# Patient Record
Sex: Female | Born: 1985 | Race: Black or African American | Hispanic: No | Marital: Single | Smoking: Never smoker
Health system: Southern US, Community
[De-identification: ages and names within clinical notes are randomized; demographics above are authoritative.]

## PROBLEM LIST (undated history)

## (undated) DIAGNOSIS — Z9109 Other allergy status, other than to drugs and biological substances: Secondary | ICD-10-CM

## (undated) DIAGNOSIS — N9489 Other specified conditions associated with female genital organs and menstrual cycle: Secondary | ICD-10-CM

## (undated) DIAGNOSIS — I10 Essential (primary) hypertension: Secondary | ICD-10-CM

## (undated) DIAGNOSIS — K219 Gastro-esophageal reflux disease without esophagitis: Secondary | ICD-10-CM

## (undated) DIAGNOSIS — B009 Herpesviral infection, unspecified: Secondary | ICD-10-CM

## (undated) DIAGNOSIS — D649 Anemia, unspecified: Secondary | ICD-10-CM

## (undated) DIAGNOSIS — R519 Headache, unspecified: Secondary | ICD-10-CM

## (undated) HISTORY — DX: Herpesviral infection, unspecified: B00.9

## (undated) HISTORY — DX: Morbid (severe) obesity due to excess calories: E66.01

## (undated) HISTORY — DX: Anemia, unspecified: D64.9

## (undated) HISTORY — PX: NO PAST SURGERIES: SHX2092

## (undated) HISTORY — DX: Other specified conditions associated with female genital organs and menstrual cycle: N94.89

## (undated) HISTORY — PX: COLONOSCOPY W/ POLYPECTOMY: SHX1380

## (undated) HISTORY — DX: Other allergy status, other than to drugs and biological substances: Z91.09

## (undated) HISTORY — DX: Headache, unspecified: R51.9

## (undated) HISTORY — PX: UPPER GI ENDOSCOPY: SHX6162

---

## 2019-05-13 ENCOUNTER — Encounter: Payer: Self-pay | Admitting: Emergency Medicine

## 2019-05-13 ENCOUNTER — Ambulatory Visit
Admission: EM | Admit: 2019-05-13 | Discharge: 2019-05-13 | Disposition: A | Discharge status: Home / Self Care | Payer: Managed Care, Other (non HMO) | Attending: Emergency Medicine | Admitting: Emergency Medicine

## 2019-05-13 ENCOUNTER — Other Ambulatory Visit: Payer: Self-pay

## 2019-05-13 DIAGNOSIS — R519 Headache, unspecified: Secondary | ICD-10-CM

## 2019-05-13 DIAGNOSIS — J0100 Acute maxillary sinusitis, unspecified: Secondary | ICD-10-CM | POA: Insufficient documentation

## 2019-05-13 DIAGNOSIS — U071 COVID-19: Secondary | ICD-10-CM | POA: Diagnosis not present

## 2019-05-13 DIAGNOSIS — Z79899 Other long term (current) drug therapy: Secondary | ICD-10-CM | POA: Insufficient documentation

## 2019-05-13 DIAGNOSIS — B349 Viral infection, unspecified: Secondary | ICD-10-CM | POA: Diagnosis present

## 2019-05-13 DIAGNOSIS — J3489 Other specified disorders of nose and nasal sinuses: Secondary | ICD-10-CM

## 2019-05-13 DIAGNOSIS — R0981 Nasal congestion: Secondary | ICD-10-CM

## 2019-05-13 DIAGNOSIS — Z8616 Personal history of COVID-19: Secondary | ICD-10-CM

## 2019-05-13 HISTORY — DX: Essential (primary) hypertension: I10

## 2019-05-13 HISTORY — DX: Personal history of COVID-19: Z86.16

## 2019-05-13 MED ORDER — AMOXICILLIN-POT CLAVULANATE 875-125 MG PO TABS: 1.0000 | ORAL_TABLET | Freq: Two times a day (BID) | ORAL | 0 refills | Status: DC

## 2019-05-13 NOTE — Discharge Instructions (Signed)
Continue over-the-counter medicine as needed.  Take medication as prescribed. Rest. Drink plenty of fluids.   Follow up with your primary care physician this week as needed. Return to Urgent care for new or worsening concerns.

## 2019-05-13 NOTE — ED Provider Notes (Signed)
[facility] ____________________________________________  Time seen: Approximately 3:07 PM  I have reviewed the triage vital signs and the nursing notes.   HISTORY  Chief Complaint sinus congestion and Headache   HPI Charlene Gregory is a 34 y.o. female presenting for evaluation of 6 days of runny nose, nasal congestion and sinus pressure.  Patient reports occasional cough.  Denies chest pain or shortness of breath.  Denies sore throat.  States that she has recurrent sinus issues with recurrent postnasal drip.  Has had increased postnasal drip in the last week.  Denies fevers.  Denies known direct sick contacts.  Continues eat and drink well.  Denies chest pain, shortness of breath, vomiting, diarrhea, fevers or changes in taste or smell.  Unresolved with Tylenol Sinus and Mucinex D.   Past Medical History:  Diagnosis Date  . Hypertension     There are no problems to display for this patient.   Past Surgical History:  Procedure Laterality Date  . NO PAST SURGERIES       No current facility-administered medications for this encounter.  Current Outpatient Medications:  .  amLODipine (NORVASC) 5 MG tablet, Take 5 mg by mouth daily., Disp: , Rfl:  .  butalbital-acetaminophen-caffeine (FIORICET) 50-325-40 MG tablet, Take 1-2 tablets by mouth every 6 (six) hours as needed., Disp: , Rfl:  .  hydrochlorothiazide (HYDRODIURIL) 25 MG tablet, Take 25 mg by mouth daily., Disp: , Rfl:  .  SAXENDA 18 MG/3ML SOPN, Inject 3 mg into the skin daily., Disp: , Rfl:  .  amoxicillin-clavulanate (AUGMENTIN) 875-125 MG tablet, Take 1 tablet by mouth every 12 (twelve) hours., Disp: 20 tablet, Rfl: 0  Allergies Patient has no known allergies.  Family History  Problem Relation Age of Onset  . Healthy Mother   . Cancer Father     Social History Social History   Tobacco Use  . Smoking status: Never Smoker  . Smokeless tobacco: Never Used  Substance Use Topics  . Alcohol use: Yes   . Drug use: Not Currently    Review of Systems Constitutional: No fever.  ENT: No sore throat. As above.  Cardiovascular: Denies chest pain. Respiratory: Denies shortness of breath. Gastrointestinal: No abdominal pain.  No nausea, no vomiting.  No diarrhea.  Musculoskeletal: Negative for back pain. Skin: Negative for rash.   ____________________________________________   PHYSICAL EXAM:  VITAL SIGNS: ED Triage Vitals  Enc Vitals Group     BP 05/13/19 1417 120/79     Pulse Rate 05/13/19 1417 86     Resp 05/13/19 1417 18     Temp 05/13/19 1417 98.1 F (36.7 C)     Temp Source 05/13/19 1417 Oral     SpO2 05/13/19 1417 100 %     Weight 05/13/19 1410 (!) 338 lb (153.3 kg)     Height 05/13/19 1410 5\' 5"  (1.651 m)     Head Circumference --      Peak Flow --      Pain Score 05/13/19 1410 7     Pain Loc --      Pain Edu? --      Excl. in [place]? --     Constitutional: Alert and oriented. Well appearing and in no acute distress. Eyes: Conjunctivae are normal.  Head: Atraumatic.Mild to moderate tenderness to palpation bilateral maxillary sinuses.  No frontal sinus tenderness palpation.  No swelling. No erythema.   Ears: no erythema, mild effusion bilateral, otherwise normal TMs bilaterally.   Nose: nasal congestion with bilateral  nasal turbinate erythema and edema.   Mouth/Throat: Mucous membranes are moist.  Oropharynx non-erythematous.No tonsillar swelling or exudate.  Neck: No stridor.  No cervical spine tenderness to palpation. Hematological/Lymphatic/Immunilogical: No cervical lymphadenopathy. Cardiovascular: Normal rate, regular rhythm. Grossly normal heart sounds.  Good peripheral circulation. Respiratory: Normal respiratory effort.  No retractions.No wheezes, rales or rhonchi. Good air movement.  Musculoskeletal: Steady gait. Neurologic:  Normal speech and language.  Skin:  Skin is warm, dry and intact. No rash noted. Psychiatric: Mood and affect are normal. Speech and  behavior are normal.  ___________________________________________   LABS (all labs ordered are listed, but only abnormal results are displayed)  Labs Reviewed  NOVEL CORONAVIRUS, NAA (HOSP ORDER, SEND-OUT TO REF LAB; TAT 18-24 HRS)     PROCEDURES Procedures   INITIAL IMPRESSION / ASSESSMENT AND PLAN / ED COURSE  Pertinent labs & imaging results that were available during my care of the patient were reviewed by me and considered in my medical decision making (see chart for details).  Very well-appearing patient.  No acute distress.  Suspect recent viral illness.  COVID-19 testing completed and advice given and counseled regarding this.  Discussed onset of sinusitis.  Prescription for Augmentin given.  Continue over-the-counter supportive care.  Rest, fluids and monitoring.  Discussed follow-up and return parameters.  Work note given.Discussed indication, risks and benefits of medications with patient.  Discussed follow up and return parameters including no resolution or any worsening concerns. Patient verbalized understanding and agreed to plan.   ____________________________________________   FINAL CLINICAL IMPRESSION(S) / ED DIAGNOSES  Final diagnoses:  Viral illness  Acute maxillary sinusitis, recurrence not specified     ED Discharge Orders         Ordered    amoxicillin-clavulanate (AUGMENTIN) 875-125 MG tablet  Every 12 hours     05/13/19 1450           Note: This dictation was prepared with Dragon dictation along with smaller phrase technology. Any transcriptional errors that result from this process are unintentional.         Marylene Land, NP 05/13/19 1619

## 2019-05-13 NOTE — ED Triage Notes (Signed)
Pt c/o sinus congestion, headache, fatigue and cough.  Started about 5 days ago.

## 2019-05-15 ENCOUNTER — Telehealth: Payer: Self-pay | Admitting: Emergency Medicine

## 2019-05-15 ENCOUNTER — Emergency Department
Admission: EM | Admit: 2019-05-15 | Discharge: 2019-05-15 | Discharge status: Against Medical Advice | Payer: Managed Care, Other (non HMO)

## 2019-05-15 LAB — NOVEL CORONAVIRUS, NAA (HOSP ORDER, SEND-OUT TO REF LAB; TAT 18-24 HRS): SARS-CoV-2, NAA: DETECTED — AB

## 2019-05-15 NOTE — Telephone Encounter (Signed)
Your test for COVID-19 was positive, meaning that you were infected with the novel coronavirus and could give the germ to others.  Please continue isolation at home, for at least 10 days since the start of your fever/cough/breathlessness and until you have had 3 consecutive days without fever (without taking a fever reducer) and with cough/breathlessness improving. Please continue good preventive care measures, including:  frequent hand-washing, avoid touching your face, cover coughs/sneezes, stay out of crowds and keep a 6 foot distance from others.  Recheck or go to the nearest hospital ED tent for re-assessment if fever/cough/breathlessness return.  Patient contacted and made aware of all results, all questions answered.   

## 2019-05-16 ENCOUNTER — Other Ambulatory Visit: Payer: Self-pay

## 2019-05-16 ENCOUNTER — Emergency Department
Admission: EM | Admit: 2019-05-16 | Discharge: 2019-05-16 | Disposition: A | Discharge status: Home / Self Care | Payer: Managed Care, Other (non HMO) | Attending: Emergency Medicine | Admitting: Emergency Medicine

## 2019-05-16 DIAGNOSIS — Z79899 Other long term (current) drug therapy: Secondary | ICD-10-CM | POA: Insufficient documentation

## 2019-05-16 DIAGNOSIS — U071 COVID-19: Secondary | ICD-10-CM | POA: Insufficient documentation

## 2019-05-16 DIAGNOSIS — I10 Essential (primary) hypertension: Secondary | ICD-10-CM | POA: Insufficient documentation

## 2019-05-16 DIAGNOSIS — R519 Headache, unspecified: Secondary | ICD-10-CM | POA: Diagnosis present

## 2019-05-16 LAB — COMPREHENSIVE METABOLIC PANEL
ALT: 21 U/L (ref 0–44)
AST: 19 U/L (ref 15–41)
Albumin: 3.9 g/dL (ref 3.5–5.0)
Alkaline Phosphatase: 66 U/L (ref 38–126)
Anion gap: 8 (ref 5–15)
BUN: 10 mg/dL (ref 6–20)
CO2: 25 mmol/L (ref 22–32)
Calcium: 9 mg/dL (ref 8.9–10.3)
Chloride: 102 mmol/L (ref 98–111)
Creatinine, Ser: 0.68 mg/dL (ref 0.44–1.00)
GFR calc Af Amer: >60 mL/min (ref >60)
GFR calc non Af Amer: >60 mL/min (ref >60)
Glucose, Bld: 86 mg/dL (ref 70–99)
Potassium: 3.4 mmol/L — ABNORMAL LOW (ref 3.5–5.1)
Sodium: 135 mmol/L (ref 135–145)
Total Bilirubin: 0.4 mg/dL (ref 0.3–1.2)
Total Protein: 7.9 g/dL (ref 6.5–8.1)

## 2019-05-16 LAB — CBC WITH DIFFERENTIAL/PLATELET
Abs Immature Granulocytes: 0.03 10*3/uL (ref 0.00–0.07)
Basophils Absolute: 0 10*3/uL (ref 0.0–0.1)
Basophils Relative: 1 %
Eosinophils Absolute: 0.2 10*3/uL (ref 0.0–0.5)
Eosinophils Relative: 2 %
HCT: 36.5 % (ref 36.0–46.0)
Hemoglobin: 11.6 g/dL — ABNORMAL LOW (ref 12.0–15.0)
Immature Granulocytes: 0 %
Lymphocytes Relative: 53 %
Lymphs Abs: 4 10*3/uL (ref 0.7–4.0)
MCH: 20.1 pg — ABNORMAL LOW (ref 26.0–34.0)
MCHC: 31.8 g/dL (ref 30.0–36.0)
MCV: 63.4 fL — ABNORMAL LOW (ref 80.0–100.0)
Monocytes Absolute: 0.9 10*3/uL (ref 0.1–1.0)
Monocytes Relative: 12 %
Neutro Abs: 2.3 10*3/uL (ref 1.7–7.7)
Neutrophils Relative %: 32 %
Platelets: 195 10*3/uL (ref 150–400)
RBC: 5.76 MIL/uL — ABNORMAL HIGH (ref 3.87–5.11)
RDW: 18.1 % — ABNORMAL HIGH (ref 11.5–15.5)
Smear Review: NORMAL
WBC: 7.4 10*3/uL (ref 4.0–10.5)
nRBC: 0 % (ref 0.0–0.2)

## 2019-05-16 MED ORDER — DEXAMETHASONE SODIUM PHOSPHATE 10 MG/ML IJ SOLN
10.0000 mg | Freq: Once | INTRAMUSCULAR | Status: AC
  Administered 2019-05-16: 10 mg via INTRAVENOUS
  Filled 2019-05-16: qty 1

## 2019-05-16 MED ORDER — SODIUM CHLORIDE 0.9 % IV BOLUS
1000.0000 mL | Freq: Once | INTRAVENOUS | Status: AC
  Administered 2019-05-16: 1000 mL via INTRAVENOUS

## 2019-05-16 MED ORDER — ONDANSETRON HCL 4 MG/2ML IJ SOLN
4.0000 mg | Freq: Once | INTRAMUSCULAR | Status: AC
  Administered 2019-05-16: 21:00:00 4 mg via INTRAVENOUS
  Filled 2019-05-16: qty 2

## 2019-05-16 MED ORDER — FENTANYL CITRATE (PF) 100 MCG/2ML IJ SOLN
50.0000 ug | Freq: Once | INTRAMUSCULAR | Status: AC
  Administered 2019-05-16: 50 ug via INTRAVENOUS
  Filled 2019-05-16: qty 2

## 2019-05-16 MED ORDER — PREDNISONE 10 MG (21) PO TBPK: ORAL_TABLET | ORAL | 0 refills | Status: DC

## 2019-05-16 NOTE — ED Triage Notes (Signed)
Patient dx with COVID yesterday. Patient c/o headache beginning Sunday. Patient has been taking Fiorcet with no relief.

## 2019-05-16 NOTE — ED Provider Notes (Signed)
[facility] Emergency Department Provider Note  ____________________________________________   First MD Initiated Contact with Patient 05/16/19 2038     (approximate)  I have reviewed the triage vital signs and the nursing notes.   HISTORY  Chief Complaint Headache    HPI Henna Dauria is a 34 y.o. female Covid positive with symptoms for approximately 5 days presents emergency department complaint of headache.  States headache is not stopped.  She has tried her normal migraine medications which has not helped.  She states she cannot sleep due to the headache.  She denies any chest pain/shortness of breath, no vomiting or diarrhea.  She states she thought it was a sinus infection but they tested her for Covid and came back positive.  She is also been taking Augmentin.    Past Medical History:  Diagnosis Date  . Hypertension     There are no problems to display for this patient.   Past Surgical History:  Procedure Laterality Date  . NO PAST SURGERIES      Prior to Admission medications   Medication Sig Start Date End Date Taking? Authorizing Provider  amLODipine (NORVASC) 5 MG tablet Take 5 mg by mouth daily. 04/02/19   [provider]  amoxicillin-clavulanate (AUGMENTIN) 875-125 MG tablet Take 1 tablet by mouth every 12 (twelve) hours. 05/13/19   Marylene Land, NP  butalbital-acetaminophen-caffeine (FIORICET) [phone] MG tablet Take 1-2 tablets by mouth every 6 (six) hours as needed. 01/23/19   [provider]  hydrochlorothiazide (HYDRODIURIL) 25 MG tablet Take 25 mg by mouth daily. 03/31/19   [provider]  predniSONE (STERAPRED UNI-PAK 21 TAB) 10 MG (21) TBPK tablet Take 6 pills on day one then decrease by 1 pill each day 05/16/19   Versie Starks, PA-C  SAXENDA 18 MG/3ML SOPN Inject 3 mg into the skin daily. 05/10/19   [provider]    Allergies Patient has no known allergies.  Family History  Problem  Relation Age of Onset  . Healthy Mother   . Cancer Father     Social History Social History   Tobacco Use  . Smoking status: Never Smoker  . Smokeless tobacco: Never Used  Substance Use Topics  . Alcohol use: Yes  . Drug use: Not Currently    Review of Systems  Constitutional: No fever/chills Eyes: No visual changes. ENT: No sore throat. Respiratory: Denies cough Cardiovascular: Denies chest pain Gastrointestinal: Denies abdominal pain Genitourinary: Negative for dysuria. Musculoskeletal: Negative for back pain. Skin: Negative for rash. Psychiatric: no mood changes,     ____________________________________________   PHYSICAL EXAM:  VITAL SIGNS: ED Triage Vitals  Enc Vitals Group     BP 05/16/19 1913 139/80     Pulse Rate 05/16/19 1913 89     Resp 05/16/19 1913 20     Temp 05/16/19 1913 98.9 F (37.2 C)     Temp Source 05/16/19 1913 Oral     SpO2 05/16/19 1913 100 %     Weight 05/16/19 1912 (!) 338 lb (153.3 kg)     Height 05/16/19 1912 5\' 6"  (1.676 m)     Head Circumference --      Peak Flow --      Pain Score 05/16/19 1922 10     Pain Loc --      Pain Edu? --      Excl. in [place]? --     Constitutional: Alert and oriented. Well appearing and in no acute distress. Eyes: Conjunctivae  are normal.  Head: Atraumatic. Nose: No congestion/rhinnorhea. Mouth/Throat: Mucous membranes are moist.   Neck:  supple no lymphadenopathy noted Cardiovascular: Normal rate, regular rhythm. Heart sounds are normal Respiratory: Normal respiratory effort.  No retractions, lungs c t a  GU: deferred Musculoskeletal: FROM all extremities, warm and well perfused Neurologic:  Normal speech and language.  Skin:  Skin is warm, dry and intact. No rash noted. Psychiatric: Mood and affect are normal. Speech and behavior are normal.  ____________________________________________   LABS (all labs ordered are listed, but only abnormal results are displayed)  Labs Reviewed    COMPREHENSIVE METABOLIC PANEL - Abnormal; Notable for the following components:      Result Value   Potassium 3.4 (*)    All other components within normal limits  CBC WITH DIFFERENTIAL/PLATELET - Abnormal; Notable for the following components:   RBC 5.76 (*)    Hemoglobin 11.6 (*)    MCV 63.4 (*)    MCH 20.1 (*)    RDW 18.1 (*)    All other components within normal limits   ____________________________________________   ____________________________________________  RADIOLOGY    ____________________________________________   PROCEDURES  Procedure(s) performed: Saline lock, normal saline 1 L IV, Decadron 10 mg IV, Zofran 4 mg IV, fentanyl 50 mcg IV   Procedures    ____________________________________________   INITIAL IMPRESSION / ASSESSMENT AND PLAN / ED COURSE  Pertinent labs & imaging results that were available during my care of the patient were reviewed by me and considered in my medical decision making (see chart for details).   Patient is a 34 year old Covid positive female that presents emergency department with concerns of a headache.  See HPI  Physical exam shows patient to appear well.  Vitals are normal.  Remainder of exam is unremarkable  CBC, metabolic panel are basically normal Saline lock, normal saline 1 L IV, Decadron 10 mg IV, fentanyl 50 mcg IV, Zofran 4 mg IV  Explained to the patient that due to this being a Covid type headache and not sure that we will be able to get her pain completely under control.  Explained to her that due to it being a virus there is not a lot we can do at this time.  I did tell her we would try steroids and fluids.  She states she understands.   Patient states she feels better after medications.  She is given prescription for steroid pack.  Follow-up with your regular doctor if not improving in 3 days.  Return emergency department if worsening.  Melesia Audet was evaluated in Emergency Department on 05/16/2019 for the  symptoms described in the history of present illness. She was evaluated in the context of the global COVID-19 pandemic, which necessitated consideration that the patient might be at risk for infection with the SARS-CoV-2 virus that causes COVID-19. Institutional protocols and algorithms that pertain to the evaluation of patients at risk for COVID-19 are in a state of rapid change based on information released by regulatory bodies including the CDC and federal and state organizations. These policies and algorithms were followed during the patient's care in the ED.   As part of my medical decision making, I reviewed the following data within the [place] notes reviewed and incorporated, Labs reviewed CBC and metabolic panel are normal, Old chart reviewed, Notes from prior ED visits and [place] Controlled Substance Database  ____________________________________________   FINAL CLINICAL IMPRESSION(S) / ED DIAGNOSES  Final diagnoses:  COVID-19  NEW MEDICATIONS STARTED DURING THIS VISIT:  New Prescriptions   PREDNISONE (STERAPRED UNI-PAK 21 TAB) 10 MG (21) TBPK TABLET    Take 6 pills on day one then decrease by 1 pill each day     Note:  This document was prepared using Dragon voice recognition software and may include unintentional dictation errors.    Versie Starks, PA-C 05/16/19 2222    Harvest Dark, MD 05/16/19 2330

## 2019-05-16 NOTE — Discharge Instructions (Addendum)
Follow-up with your regular doctor as needed.  Return emergency department worsening.  Use steroid pack as prescribed.  You may use this along with your regular migraine medications.  This helps to decrease the inflammation due to Covid and should help you with the pain.

## 2019-05-16 NOTE — ED Notes (Signed)
Pt present today w/ a migraine that has been present x 5 days. Pt tested positive for COVID yesterday. Pt denies typical covid symptoms at this time.

## 2019-05-24 ENCOUNTER — Ambulatory Visit
Admission: EM | Admit: 2019-05-24 | Discharge: 2019-05-24 | Disposition: A | Discharge status: Home / Self Care | Payer: Managed Care, Other (non HMO) | Attending: Family Medicine | Admitting: Family Medicine

## 2019-05-24 ENCOUNTER — Other Ambulatory Visit: Payer: Managed Care, Other (non HMO)

## 2019-05-24 ENCOUNTER — Other Ambulatory Visit: Payer: Self-pay

## 2019-05-24 DIAGNOSIS — K219 Gastro-esophageal reflux disease without esophagitis: Secondary | ICD-10-CM | POA: Diagnosis not present

## 2019-05-24 MED ORDER — OMEPRAZOLE 20 MG PO CPDR: 20.0000 mg | DELAYED_RELEASE_CAPSULE | Freq: Every day | ORAL | 0 refills | Status: DC

## 2019-05-24 NOTE — Discharge Instructions (Signed)
Continue TUMS as needed and diet modifications

## 2019-05-24 NOTE — ED Triage Notes (Signed)
Pt presents with c/o acid reflux that comes and goes. She has had problems with reflux in the past. She has taken Prilosec previously with some improvement in symptoms. She has most recently taken Pepcid AC with little to no relief. She states that no matter what she eats she is uncomfortable and has had to even sleep sitting up. She denies emesis or abd pain.

## 2019-05-26 NOTE — ED Provider Notes (Signed)
[facility]    CSN: [id number] Arrival date & time: 05/24/19  1007      History   Chief Complaint Chief Complaint  Patient presents with  . Gastroesophageal Reflux    HPI Charlene Gregory is a 34 y.o. female.   34 yo female with a c/o acid reflux on and off chronically. Denies any vomiting, fevers, chills, pain.    Gastroesophageal Reflux    Past Medical History:  Diagnosis Date  . COVID-19   . Hypertension     There are no problems to display for this patient.   Past Surgical History:  Procedure Laterality Date  . NO PAST SURGERIES      OB History   No obstetric history on file.      Home Medications    Prior to Admission medications   Medication Sig Start Date End Date Taking? Authorizing Provider  amLODipine (NORVASC) 5 MG tablet Take 5 mg by mouth daily. 04/02/19  Yes [provider]  butalbital-acetaminophen-caffeine (FIORICET) 50-325-40 MG tablet Take 1-2 tablets by mouth every 6 (six) hours as needed. 01/23/19  Yes [provider]  hydrochlorothiazide (HYDRODIURIL) 25 MG tablet Take 25 mg by mouth daily. 03/31/19  Yes [provider]  SAXENDA 18 MG/3ML SOPN Inject 3 mg into the skin daily. 05/10/19  Yes [provider]  amoxicillin-clavulanate (AUGMENTIN) 875-125 MG tablet Take 1 tablet by mouth every 12 (twelve) hours. 05/13/19   Marylene Land, NP  omeprazole (PRILOSEC) 20 MG capsule Take 1 capsule (20 mg total) by mouth daily. 05/24/19   Norval Gable, MD  predniSONE (STERAPRED UNI-PAK 21 TAB) 10 MG (21) TBPK tablet Take 6 pills on day one then decrease by 1 pill each day 05/16/19   Versie Starks, PA-C    Family History Family History  Problem Relation Age of Onset  . Healthy Mother   . Cancer Father     Social History Social History   Tobacco Use  . Smoking status: Never Smoker  . Smokeless tobacco: Never Used  Substance Use Topics  . Alcohol use: Yes  . Drug use: Not Currently      Allergies   Patient has no known allergies.   Review of Systems Review of Systems   Physical Exam Triage Vital Signs ED Triage Vitals  Enc Vitals Group     BP 05/24/19 1046 137/78     Pulse Rate 05/24/19 1046 87     Resp --      Temp 05/24/19 1046 98.2 F (36.8 C)     Temp Source 05/24/19 1046 Oral     SpO2 05/24/19 1046 100 %     Weight 05/24/19 1043 (!) 338 lb (153.3 kg)     Height 05/24/19 1043 5\' 6"  (1.676 m)     Head Circumference --      Peak Flow --      Pain Score 05/24/19 1043 6     Pain Loc --      Pain Edu? --      Excl. in [place]? --    No data found.  Updated Vital Signs BP 137/78 (BP Location: Left Wrist)   Pulse 87   Temp 98.2 F (36.8 C) (Oral)   Ht 5\' 6"  (1.676 m)   Wt (!) 153.3 kg   LMP 04/25/2019 (Approximate)   SpO2 100%   BMI 54.55 kg/m   Visual Acuity Right Eye Distance:   Left Eye Distance:   Bilateral Distance:    Right Eye  Near:   Left Eye Near:    Bilateral Near:     Physical Exam Vitals and nursing note reviewed.  Constitutional:      General: She is not in acute distress.    Appearance: She is not toxic-appearing or diaphoretic.  Cardiovascular:     Rate and Rhythm: Normal rate.  Pulmonary:     Effort: Pulmonary effort is normal. No respiratory distress.  Abdominal:     General: Bowel sounds are normal. There is no distension.     Palpations: Abdomen is soft.     Tenderness: There is no abdominal tenderness.  Neurological:     Mental Status: She is alert.      UC Treatments / Results  Labs (all labs ordered are listed, but only abnormal results are displayed) Labs Reviewed - No data to display  EKG   Radiology No results found.  Procedures Procedures (including critical care time)  Medications Ordered in UC Medications - No data to display  Initial Impression / Assessment and Plan / UC Course  I have reviewed the triage vital signs and the nursing notes.  Pertinent labs & imaging results that  were available during my care of the patient were reviewed by me and considered in my medical decision making (see chart for details).      Final Clinical Impressions(s) / UC Diagnoses   Final diagnoses:  Gastroesophageal reflux disease, unspecified whether esophagitis present     Discharge Instructions     Continue TUMS as needed and diet modifications    ED Prescriptions    Medication Sig Dispense Auth. Provider   omeprazole (PRILOSEC) 20 MG capsule Take 1 capsule (20 mg total) by mouth daily. 30 capsule Norval Gable, MD      1. diagnosis reviewed with patient 2. rx as per orders above; reviewed possible side effects, interactions, risks and benefits  3. Recommend supportive treatment as above 4. Follow-up prn if symptoms worsen or don't improve   PDMP not reviewed this encounter.   Norval Gable, MD 05/26/19 [phone]

## 2019-05-27 ENCOUNTER — Ambulatory Visit: Payer: Managed Care, Other (non HMO) | Attending: Internal Medicine

## 2019-05-27 ENCOUNTER — Other Ambulatory Visit: Payer: Self-pay

## 2019-05-27 ENCOUNTER — Emergency Department: Payer: Managed Care, Other (non HMO)

## 2019-05-27 ENCOUNTER — Encounter: Payer: Self-pay | Admitting: Emergency Medicine

## 2019-05-27 ENCOUNTER — Emergency Department
Admission: EM | Admit: 2019-05-27 | Discharge: 2019-05-27 | Disposition: A | Discharge status: Home / Self Care | Payer: Managed Care, Other (non HMO) | Attending: Emergency Medicine | Admitting: Emergency Medicine

## 2019-05-27 DIAGNOSIS — Z79899 Other long term (current) drug therapy: Secondary | ICD-10-CM | POA: Diagnosis not present

## 2019-05-27 DIAGNOSIS — Z20822 Contact with and (suspected) exposure to covid-19: Secondary | ICD-10-CM

## 2019-05-27 DIAGNOSIS — I10 Essential (primary) hypertension: Secondary | ICD-10-CM | POA: Diagnosis not present

## 2019-05-27 DIAGNOSIS — K219 Gastro-esophageal reflux disease without esophagitis: Secondary | ICD-10-CM | POA: Insufficient documentation

## 2019-05-27 DIAGNOSIS — R079 Chest pain, unspecified: Secondary | ICD-10-CM | POA: Diagnosis present

## 2019-05-27 HISTORY — DX: Gastro-esophageal reflux disease without esophagitis: K21.9

## 2019-05-27 LAB — CBC
HCT: 36.7 % (ref 36.0–46.0)
Hemoglobin: 11.8 g/dL — ABNORMAL LOW (ref 12.0–15.0)
MCH: 20.6 pg — ABNORMAL LOW (ref 26.0–34.0)
MCHC: 32.2 g/dL (ref 30.0–36.0)
MCV: 63.9 fL — ABNORMAL LOW (ref 80.0–100.0)
Platelets: 221 10*3/uL (ref 150–400)
RBC: 5.74 MIL/uL — ABNORMAL HIGH (ref 3.87–5.11)
RDW: 19.5 % — ABNORMAL HIGH (ref 11.5–15.5)
WBC: 10.7 10*3/uL — ABNORMAL HIGH (ref 4.0–10.5)
nRBC: 0 % (ref 0.0–0.2)

## 2019-05-27 LAB — BASIC METABOLIC PANEL
Anion gap: 5 (ref 5–15)
BUN: 9 mg/dL (ref 6–20)
CO2: 31 mmol/L (ref 22–32)
Calcium: 9.1 mg/dL (ref 8.9–10.3)
Chloride: 100 mmol/L (ref 98–111)
Creatinine, Ser: 0.67 mg/dL (ref 0.44–1.00)
GFR calc Af Amer: >60 mL/min (ref >60)
GFR calc non Af Amer: >60 mL/min (ref >60)
Glucose, Bld: 95 mg/dL (ref 70–99)
Potassium: 3.2 mmol/L — ABNORMAL LOW (ref 3.5–5.1)
Sodium: 136 mmol/L (ref 135–145)

## 2019-05-27 LAB — TROPONIN I (HIGH SENSITIVITY): Troponin I (High Sensitivity): <2 ng/L (ref <18)

## 2019-05-27 LAB — POCT PREGNANCY, URINE: Preg Test, Ur: NEGATIVE

## 2019-05-27 MED ORDER — SUCRALFATE 1 G PO TABS: 1.0000 g | ORAL_TABLET | Freq: Four times a day (QID) | ORAL | 0 refills | Status: DC | PRN

## 2019-05-27 NOTE — ED Notes (Signed)
Pt states she has had some acid reflux for the last few days. Pt states she has tried Prilosec with no relief. Pt ambulatory with steady gait to room at this time.

## 2019-05-27 NOTE — Discharge Instructions (Signed)
Continue to take the Prilosec as prescribed.  You should add on the Carafate 4 times daily over the next few weeks.  We have given you referral to see a gastroenterologist and you can make an appointment there for follow-up as well as with your primary care.  Return to the ER for new, worsening, or persistent severe pain, shortness of breath, fever, vomiting, or any other new or worsening symptoms that concern you.

## 2019-05-27 NOTE — ED Triage Notes (Signed)
Pt in via POV, complaining of acid reflux over the last few days, taking Prilosec at home without any relief.  NAD noted at this time.

## 2019-05-27 NOTE — ED Provider Notes (Signed)
[facility] Emergency Department Provider Note ____________________________________________   First MD Initiated Contact with Patient 05/27/19 1652     (approximate)  I have reviewed the triage vital signs and the nursing notes.   HISTORY  Chief Complaint Gastroesophageal Reflux    HPI Charlene Gregory is a 34 y.o. female with PMH as noted below who presents with chest discomfort, intermittent course, worse after eating, and described as a burning sensation.  Is associated with belching and gas.  She states it feels similar to prior GERD.  The patient denies any shortness of breath, cough, fever, or lightheadedness.  She states that she tested positive for Covid a few weeks ago.  Past Medical History:  Diagnosis Date  . Acid reflux disease   . COVID-19   . Hypertension     There are no problems to display for this patient.   Past Surgical History:  Procedure Laterality Date  . NO PAST SURGERIES      Prior to Admission medications   Medication Sig Start Date End Date Taking? Authorizing Provider  amLODipine (NORVASC) 5 MG tablet Take 5 mg by mouth daily. 04/02/19   [provider]  amoxicillin-clavulanate (AUGMENTIN) 875-125 MG tablet Take 1 tablet by mouth every 12 (twelve) hours. 05/13/19   Marylene Land, NP  butalbital-acetaminophen-caffeine (FIORICET) [phone] MG tablet Take 1-2 tablets by mouth every 6 (six) hours as needed. 01/23/19   [provider]  hydrochlorothiazide (HYDRODIURIL) 25 MG tablet Take 25 mg by mouth daily. 03/31/19   [provider]  omeprazole (PRILOSEC) 20 MG capsule Take 1 capsule (20 mg total) by mouth daily. 05/24/19   Norval Gable, MD  predniSONE (STERAPRED UNI-PAK 21 TAB) 10 MG (21) TBPK tablet Take 6 pills on day one then decrease by 1 pill each day 05/16/19   Versie Starks, PA-C  SAXENDA 18 MG/3ML SOPN Inject 3 mg into the skin daily. 05/10/19   [provider]    Allergies Patient  has no known allergies.  Family History  Problem Relation Age of Onset  . Healthy Mother   . Cancer Father     Social History Social History   Tobacco Use  . Smoking status: Never Smoker  . Smokeless tobacco: Never Used  Substance Use Topics  . Alcohol use: Yes  . Drug use: Not Currently    Review of Systems  Constitutional: No fever/chills. Eyes: No redness. ENT: No sore throat. Cardiovascular: Positive for chest pain. Respiratory: Denies shortness of breath. Gastrointestinal: No vomiting or diarrhea. Genitourinary: Negative for flank pain. Musculoskeletal: Negative for back pain. Skin: Negative for rash. Neurological: Negative for headache.   ____________________________________________   PHYSICAL EXAM:  VITAL SIGNS: ED Triage Vitals  Enc Vitals Group     BP 05/27/19 1600 136/83     Pulse Rate 05/27/19 1600 (!) 110     Resp 05/27/19 1600 16     Temp 05/27/19 1600 98.5 F (36.9 C)     Temp Source 05/27/19 1600 Oral     SpO2 05/27/19 1600 98 %     Weight 05/27/19 1601 (!) 335 lb (152 kg)     Height 05/27/19 1601 5\' 5"  (1.651 m)     Head Circumference --      Peak Flow --      Pain Score 05/27/19 1601 0     Pain Loc --      Pain Edu? --      Excl. in [place]? --  Constitutional: Alert and oriented. Well appearing and in no acute distress. Eyes: Conjunctivae are normal.  No scleral icterus. Head: Atraumatic. Nose: No congestion/rhinnorhea. Mouth/Throat: Mucous membranes are moist.   Neck: Normal range of motion.  Cardiovascular: Normal rate, regular rhythm.   Good peripheral circulation. Respiratory: Normal respiratory effort.  No retractions.  Gastrointestinal: No distention.  Musculoskeletal:  Extremities warm and well perfused.  Neurologic:  Normal speech and language. No gross focal neurologic deficits are appreciated.  Skin:  Skin is warm and dry. No rash noted. Psychiatric: Mood and affect are normal. Speech and behavior are  normal.  ____________________________________________   LABS (all labs ordered are listed, but only abnormal results are displayed)  Labs Reviewed  BASIC METABOLIC PANEL - Abnormal; Notable for the following components:      Result Value   Potassium 3.2 (*)    All other components within normal limits  CBC - Abnormal; Notable for the following components:   WBC 10.7 (*)    RBC 5.74 (*)    Hemoglobin 11.8 (*)    MCV 63.9 (*)    MCH 20.6 (*)    RDW 19.5 (*)    All other components within normal limits  POC URINE PREG, ED  POCT PREGNANCY, URINE  TROPONIN I (HIGH SENSITIVITY)   ____________________________________________  EKG  ED ECG REPORT I, Arta Silence, the attending physician, personally viewed and interpreted this ECG.  Date: 05/27/2019 EKG Time: 1602 Rate: 117 Rhythm: Sinus tachycardia QRS Axis: normal Intervals: normal ST/T Wave abnormalities: normal Narrative Interpretation: no evidence of acute ischemia  ____________________________________________  RADIOLOGY  CXR: No focal infiltrate or other acute abnormality  ____________________________________________   PROCEDURES  Procedure(s) performed: No  Procedures  Critical Care performed: No ____________________________________________   INITIAL IMPRESSION / ASSESSMENT AND PLAN / ED COURSE  Pertinent labs & imaging results that were available during my care of the patient were reviewed by me and considered in my medical decision making (see chart for details).  34 year old female with PMH as noted above and a recent history of COVID-19 presents with persistent chest discomfort, worse after eating and associated with belching and gas.  She has had no vomiting, shortness of breath, fever, or lightheadedness.  The patient has been on Prilosec for about the last month without relief.  She states that she has been modifying her diet for presumed GERD.  On exam, she is overall well-appearing.  She  was slightly tachycardic at triage with otherwise normal vital signs.  The remainder of the physical exam is unremarkable.  EKG shows sinus tachycardia with no ischemic findings.  Lab work-up was obtained from triage and shows no significant acute abnormalities.  The troponin is negative.  Overall presentation in this young and otherwise relatively healthy patient is consistent with GERD, given the prior history of the same and the nature and quality of the symptoms.  The patient has no risk factors for ACS or any findings to suggest cardiac etiology.  Although the patient has mild tachycardia, and thus fails the PERC rule, she has no leg swelling or other signs or symptoms of DVT, is not on OCPs, and has had no recent surgery or immobilization.  She denies any shortness of breath, and has no EKG changes or hypoxia.  Also, a PE would not explain the GI symptoms.  There is no indication for PE work-up at this time.  I have instructed the patient to continue Prilosec and will add on Carafate.  I instructed her to  continue her dietary modifications.  I will refer her to GI for follow-up.  At this time, the patient is stable for discharge home.  Return precautions given, and she expresses understanding.  ____________________________________________   FINAL CLINICAL IMPRESSION(S) / ED DIAGNOSES  Final diagnoses:  Gastroesophageal reflux disease, unspecified whether esophagitis present      NEW MEDICATIONS STARTED DURING THIS VISIT:  New Prescriptions   No medications on file     Note:  This document was prepared using Dragon voice recognition software and may include unintentional dictation errors.    Arta Silence, MD 05/27/19 1718

## 2019-05-28 LAB — NOVEL CORONAVIRUS, NAA: SARS-CoV-2, NAA: DETECTED — AB

## 2019-06-03 ENCOUNTER — Telehealth: Payer: Self-pay | Admitting: Gastroenterology

## 2019-06-03 NOTE — Telephone Encounter (Signed)
Called pt to offer f/u from ER with Dr. Marius Ditch,  she has already followed up with a GI doctor per pt

## 2019-06-05 ENCOUNTER — Other Ambulatory Visit: Payer: Self-pay

## 2019-06-05 ENCOUNTER — Ambulatory Visit
Admission: EM | Admit: 2019-06-05 | Discharge: 2019-06-05 | Disposition: A | Discharge status: Home / Self Care | Payer: Managed Care, Other (non HMO) | Attending: Urgent Care | Admitting: Urgent Care

## 2019-06-05 DIAGNOSIS — Z8616 Personal history of COVID-19: Secondary | ICD-10-CM

## 2019-06-05 DIAGNOSIS — J069 Acute upper respiratory infection, unspecified: Secondary | ICD-10-CM | POA: Diagnosis not present

## 2019-06-05 MED ORDER — DOXYCYCLINE HYCLATE 100 MG PO CAPS: 100.0000 mg | ORAL_CAPSULE | Freq: Two times a day (BID) | ORAL | 0 refills | Status: AC

## 2019-06-05 NOTE — ED Triage Notes (Signed)
Pt presents with c/o recent COVID diagnosis (05/13/19). Pt reports most symptoms have improved but still has some sinus congestion, pnd and dry cough. Pt also reports some "whistling" sounds when breathing, she thinks may be coming from her nose. She has tried nasal sprays, saline and OTC sinus meds. She denies fever/chills or other symptoms.

## 2019-06-05 NOTE — Discharge Instructions (Addendum)
It was very nice seeing you today in clinic. Thank you for entrusting me with your care.   Rest and continue to increase hydration. Use antibiotic as prescribed. Continue mucinex and flonase as needed.   Make arrangements to follow up with your regular doctor in 1 week for re-evaluation if not improving. If your symptoms/condition worsens, please seek follow up care either here or in the ER. Please remember, our [facility] providers are "right here with you" when you need [place].   Again, it was my pleasure to take care of you today. Thank you for choosing our clinic. I hope that you start to feel better quickly.   Honor Loh, MSN, APRN, FNP-C, CEN Advanced Practice Provider [facility]

## 2019-06-06 ENCOUNTER — Encounter: Payer: Self-pay | Admitting: Urgent Care

## 2019-06-06 NOTE — ED Provider Notes (Signed)
[place]   Name: Charlene Gregory DOB: [birth year] MRN: [id number] CSN: [id number] PCP: Patient, No Pcp Per  Arrival date and time:  06/05/19 1226  Chief Complaint:  Nasal Congestion and Headache   NOTE: Prior to seeing the patient today, I have reviewed the triage nursing documentation and vital signs. Clinical staff has updated patient's PMH/PSHx, current medication list, and drug allergies/intolerances to ensure comprehensive history available to assist in medical decision making.   History:   HPI: Charlene Gregory is a 34 y.o. female who presents today with complaints of nasal congestion, cough, and generalized headaches that has been going on since she was diagnosed with SARS-CoV-2 (novel coronavirus) on 05/23/2019. Patient reports that she has improved overall since her diagnosis, however the aforementioned symptoms persist. Patient attempted to return to work, however she advised that he employer would not allow her to return until she was retested following her quarantine period. Patient was retested for SARS-CoV-2 on 05/28/2019, at which time her results were still showing as positive. Patient presents today reporting that she hears a "whistling and rattling" when she breathes. PMH (+) seasonal allergies for which she uses daily cetirizine. Patient denies any fevers or chills. She denies that she has experienced any nausea, vomiting, diarrhea, or abdominal pain. She is eating and drinking well. In efforts to conservatively manage her symptoms at home, the patient notes that she has used guaifenesin, pseudoephedrine, and fluticasone, which have not helped to improve her symptoms significantly.   Past Medical History:  Diagnosis Date  . Acid reflux disease   . History of 2019 novel coronavirus disease (COVID-19) 05/13/2019  . Hypertension     Past Surgical History:  Procedure Laterality Date  . NO PAST SURGERIES      Family History  Problem Relation Age of Onset  . Healthy Mother    . Cancer Father     Social History   Tobacco Use  . Smoking status: Never Smoker  . Smokeless tobacco: Never Used  Substance Use Topics  . Alcohol use: Yes  . Drug use: Not Currently    There are no problems to display for this patient.   Home Medications:    Current Meds  Medication Sig  . amLODipine (NORVASC) 5 MG tablet Take 5 mg by mouth daily.  . butalbital-acetaminophen-caffeine (FIORICET) 50-325-40 MG tablet Take 1-2 tablets by mouth every 6 (six) hours as needed.  . hydrochlorothiazide (HYDRODIURIL) 25 MG tablet Take 25 mg by mouth daily.  Marland Kitchen omeprazole (PRILOSEC) 20 MG capsule Take 1 capsule (20 mg total) by mouth daily.  Marland Kitchen SAXENDA 18 MG/3ML SOPN Inject 3 mg into the skin daily.  . sucralfate (CARAFATE) 1 g tablet Take 1 tablet (1 g total) by mouth 4 (four) times daily as needed for up to 15 days.    Allergies:   Patient has no known allergies.  Review of Systems (ROS): Review of Systems  Constitutional: Negative for fatigue and fever.  HENT: Positive for congestion. Negative for ear pain, postnasal drip, rhinorrhea, sinus pressure, sinus pain, sneezing and sore throat.   Eyes: Negative for pain, discharge and redness.  Respiratory: Positive for cough. Negative for chest tightness and shortness of breath.   Cardiovascular: Negative for chest pain and palpitations.  Gastrointestinal: Negative for abdominal pain, diarrhea, nausea and vomiting.  Musculoskeletal: Negative for arthralgias, back pain, myalgias and neck pain.  Skin: Negative for color change, pallor and rash.  Allergic/Immunologic: Positive for environmental allergies.  Neurological: Positive for headaches. Negative for dizziness,  syncope and weakness.  Hematological: Negative for adenopathy.     Vital Signs: Today's Vitals   06/05/19 1247 06/05/19 1249 06/05/19 1324  BP:  (!) 144/90   Pulse:  95   Temp:  98.6 F (37 C)   TempSrc:  Oral   SpO2:  99%   Weight: (!) 335 lb (152 kg)    Height:  5\' 5"  (1.651 m)    PainSc: 0-No pain  0-No pain    Physical Exam: Physical Exam  Constitutional: She is oriented to person, place, and time and well-developed, well-nourished, and in no distress.  HENT:  Head: Normocephalic and atraumatic.  Right Ear: Tympanic membrane normal.  Left Ear: Tympanic membrane normal.  Nose: Mucosal edema and rhinorrhea present. No sinus tenderness.  Mouth/Throat: Uvula is midline and mucous membranes are normal. Posterior oropharyngeal erythema present. No oropharyngeal exudate or posterior oropharyngeal edema.  Eyes: Pupils are equal, round, and reactive to light.  Cardiovascular: Normal rate, regular rhythm, normal heart sounds and intact distal pulses.  Pulmonary/Chest: Effort normal. She has rhonchi (upper airways; clears mostly with cough).  Mild cough noted in clinic. No SOB or increased WOB. No distress. Able to speak in complete sentences without difficulties. SPO2 99% on RA.  Lymphadenopathy:    She has no cervical adenopathy.  Neurological: She is alert and oriented to person, place, and time. Gait normal.  Skin: Skin is warm and dry. No rash noted. She is not diaphoretic.  Psychiatric: Mood, memory, affect and judgment normal.  Nursing note and vitals reviewed.   Urgent Care Treatments / Results:   No orders of the defined types were placed in this encounter.   LABS: PLEASE NOTE: all labs that were ordered this encounter are listed, however only abnormal results are displayed. Labs Reviewed - No data to display  EKG: -None  RADIOLOGY: No results found.  PROCEDURES: Procedures  MEDICATIONS RECEIVED THIS VISIT: Medications - No data to display  PERTINENT CLINICAL COURSE NOTES/UPDATES:   Initial Impression / Assessment and Plan / Urgent Care Course:  Pertinent labs & imaging results that were available during my care of the patient were personally reviewed by me and considered in my medical decision making (see lab/imaging section  of note for values and interpretations).  Charlene Gregory is a 34 y.o. female who presents to [facility] today with complaints of Nasal Congestion and Headache  Patient is well appearing overall in clinic today. She does not appear to be in any acute distress. Presenting symptoms (see HPI) and exam as documented above. Symptoms persistent since SARS-CoV-2 (novel coronavirus) diagnosis on 05/13/2019. She has tried several OTC interventions including mucolytics, decongestants, and nasal sprays. No fevers. Cough is minimal. Eating and drinking well. Given the chronicity of her symptoms, will proceed with treatment for likely secondary bacterial infection. Patient recently treated with amoxicillin-clavulanate back in January. Will cover current infection with a 7 day course of oral doxycycline. Discussed supportive care measures at home during acute phase of illness. Patient to rest as much as possible. She was encouraged to ensure adequate hydration (water and ORS) to prevent dehydration and electrolyte derangements. Patient may use APAP and/or IBU on an as needed basis for pain/fever. Advised to continue guaifenesin, pseudoephedrine, fluticasone, and cetirizine as needed for symptomatic relief.   Discussed follow up with primary care physician in 1 week for re-evaluation. I have reviewed the follow up and strict return precautions for any new or worsening symptoms. Patient is aware of symptoms that would  be deemed urgent/emergent, and would thus require further evaluation either here or in the emergency department. At the time of discharge, she verbalized understanding and consent with the discharge plan as it was reviewed with her. All questions were fielded by provider and/or clinic staff prior to patient discharge.    Final Clinical Impressions / Urgent Care Diagnoses:   Final diagnoses:  Upper respiratory tract infection, unspecified type  History of 2019 novel coronavirus disease (COVID-19)     New Prescriptions:  [place] Controlled Substance Registry consulted? Not Applicable  Meds ordered this encounter  Medications  . doxycycline (VIBRAMYCIN) 100 MG capsule    Sig: Take 1 capsule (100 mg total) by mouth 2 (two) times daily for 7 days.    Dispense:  14 capsule    Refill:  0    Recommended Follow up Care:  Patient encouraged to follow up with the following provider within the specified time frame, or sooner as dictated by the severity of her symptoms. As always, she was instructed that for any urgent/emergent care needs, she should seek care either here or in the emergency department for more immediate evaluation.  Follow-up Information    PCP In 1 week.   Why: General reassessment of symptoms if not improving        NOTE: This note was prepared using Lobbyist along with smaller Company secretary. Despite my best ability to proofread, there is the potential that transcriptional errors may still occur from this process, and are completely unintentional.    Karen Kitchens, NP 06/06/19 1727

## 2019-06-10 ENCOUNTER — Ambulatory Visit: Payer: Managed Care, Other (non HMO) | Attending: Internal Medicine

## 2019-06-10 DIAGNOSIS — Z20822 Contact with and (suspected) exposure to covid-19: Secondary | ICD-10-CM

## 2019-06-12 LAB — NOVEL CORONAVIRUS, NAA: SARS-CoV-2, NAA: NOT DETECTED

## 2019-06-17 ENCOUNTER — Telehealth: Payer: Self-pay | Admitting: *Deleted

## 2019-06-17 NOTE — Telephone Encounter (Signed)
Pt calling to request results of negative covid results be mailed to her; [facility] number provided.

## 2019-06-21 DIAGNOSIS — G43709 Chronic migraine without aura, not intractable, without status migrainosus: Secondary | ICD-10-CM | POA: Insufficient documentation

## 2019-06-21 DIAGNOSIS — J309 Allergic rhinitis, unspecified: Secondary | ICD-10-CM | POA: Insufficient documentation

## 2019-06-21 DIAGNOSIS — I10 Essential (primary) hypertension: Secondary | ICD-10-CM | POA: Insufficient documentation

## 2019-06-21 DIAGNOSIS — K219 Gastro-esophageal reflux disease without esophagitis: Secondary | ICD-10-CM | POA: Insufficient documentation

## 2019-07-15 ENCOUNTER — Ambulatory Visit
Admission: EM | Admit: 2019-07-15 | Discharge: 2019-07-15 | Disposition: A | Discharge status: Home / Self Care | Payer: Managed Care, Other (non HMO) | Attending: Family Medicine | Admitting: Family Medicine

## 2019-07-15 DIAGNOSIS — J302 Other seasonal allergic rhinitis: Secondary | ICD-10-CM | POA: Diagnosis not present

## 2019-07-15 MED ORDER — PREDNISONE 20 MG PO TABS: 20.0000 mg | ORAL_TABLET | Freq: Every day | ORAL | 0 refills | Status: DC

## 2019-07-15 NOTE — Discharge Instructions (Signed)
Follow up with your primary care provider as scheduled this week

## 2019-07-15 NOTE — ED Triage Notes (Signed)
Pt states she had been on Claritin and her MD changed her to Singulair and Levocetirizine as well as Flonase BID about two weeks ago.  Pt states that within a couple days of the med change she started having issues with her throat feeling like it needs cleared.  Has an upcoming appt but doesn't feel like she can wait. Feels like it also isn't helping allergies - is noticing her seasonal allergy symptoms more than before.

## 2019-07-17 NOTE — ED Provider Notes (Signed)
[facility]    CSN: [id number] Arrival date & time: 07/15/19  1530      History   Chief Complaint Chief Complaint  Patient presents with  . Medication Reaction    HPI Charlene Gregory is a 34 y.o. female.   34 yo female with a c/o post nasal drainage, clearing of throat frequently and allergies that are not improving with singulair, levocitirizine and flonase. Denies any fevers, chills, wheezing, chest pain, shortness of breath. States she has an appointment with her PCP later this week but felt she couldn't wait.      Past Medical History:  Diagnosis Date  . Acid reflux disease   . History of 2019 novel coronavirus disease (COVID-19) 05/13/2019  . Hypertension     There are no problems to display for this patient.   Past Surgical History:  Procedure Laterality Date  . NO PAST SURGERIES      OB History   No obstetric history on file.      Home Medications    Prior to Admission medications   Medication Sig Start Date End Date Taking? Authorizing Provider  amLODipine (NORVASC) 5 MG tablet Take 5 mg by mouth daily. 04/02/19  Yes [provider]  butalbital-acetaminophen-caffeine (FIORICET) 50-325-40 MG tablet Take 1-2 tablets by mouth every 6 (six) hours as needed. 01/23/19  Yes [provider]  fluticasone (FLONASE) 50 MCG/ACT nasal spray Place into both nostrils 2 (two) times daily.   Yes [provider]  hydrochlorothiazide (HYDRODIURIL) 25 MG tablet Take 25 mg by mouth daily. 03/31/19  Yes [provider]  levocetirizine (XYZAL) 5 MG tablet Take 5 mg by mouth every evening.   Yes [provider]  montelukast (SINGULAIR) 10 MG tablet Take 10 mg by mouth at bedtime.   Yes [provider]  pantoprazole (PROTONIX) 20 MG tablet Take 20 mg by mouth 2 (two) times daily.   Yes [provider]  SAXENDA 18 MG/3ML SOPN Inject 3 mg into the skin daily. 05/10/19  Yes [provider]  omeprazole  (PRILOSEC) 20 MG capsule Take 1 capsule (20 mg total) by mouth daily. 05/24/19   Norval Gable, MD  predniSONE (DELTASONE) 20 MG tablet Take 1 tablet (20 mg total) by mouth daily. 07/15/19   Norval Gable, MD  sucralfate (CARAFATE) 1 g tablet Take 1 tablet (1 g total) by mouth 4 (four) times daily as needed for up to 15 days. 05/27/19 06/11/19  Arta Silence, MD    Family History Family History  Problem Relation Age of Onset  . Healthy Mother   . Cancer Father     Social History Social History   Tobacco Use  . Smoking status: Never Smoker  . Smokeless tobacco: Never Used  Substance Use Topics  . Alcohol use: Yes    Comment: rarely   . Drug use: Not Currently     Allergies   Patient has no known allergies.   Review of Systems Review of Systems   Physical Exam Triage Vital Signs ED Triage Vitals  Enc Vitals Group     BP 07/15/19 1554 132/90     Pulse Rate 07/15/19 1554 98     Resp 07/15/19 1554 20     Temp 07/15/19 1554 98.4 F (36.9 C)     Temp src --      SpO2 07/15/19 1554 100 %     Weight --      Height --      Head Circumference --  Peak Flow --      Pain Score 07/15/19 1548 0     Pain Loc --      Pain Edu? --      Excl. in [place]? --    No data found.  Updated Vital Signs BP 132/90 (BP Location: Other (Comment)) Comment (BP Location): left forearm  Pulse 98   Temp 98.4 F (36.9 C)   Resp 20   LMP 06/19/2019 (Exact Date)   SpO2 100%   Visual Acuity Right Eye Distance:   Left Eye Distance:   Bilateral Distance:    Right Eye Near:   Left Eye Near:    Bilateral Near:     Physical Exam Vitals and nursing note reviewed.  Constitutional:      General: She is not in acute distress.    Appearance: She is not toxic-appearing or diaphoretic.  HENT:     Right Ear: Tympanic membrane normal.     Left Ear: Tympanic membrane normal.     Nose: Congestion and rhinorrhea present.  Pulmonary:     Effort: Pulmonary effort is normal. No respiratory  distress.     Breath sounds: Normal breath sounds.  Skin:    Findings: No rash.  Neurological:     Mental Status: She is alert.      UC Treatments / Results  Labs (all labs ordered are listed, but only abnormal results are displayed) Labs Reviewed - No data to display  EKG   Radiology No results found.  Procedures Procedures (including critical care time)  Medications Ordered in UC Medications - No data to display  Initial Impression / Assessment and Plan / UC Course  I have reviewed the triage vital signs and the nursing notes.  Pertinent labs & imaging results that were available during my care of the patient were reviewed by me and considered in my medical decision making (see chart for details).      Final Clinical Impressions(s) / UC Diagnoses   Final diagnoses:  Seasonal allergies     Discharge Instructions     Follow up with your primary care provider as scheduled this week    ED Prescriptions    Medication Sig Dispense Auth. Provider   predniSONE (DELTASONE) 20 MG tablet Take 1 tablet (20 mg total) by mouth daily. 5 tablet Norval Gable, MD      1. diagnosis reviewed with patient 2. rx as per orders above; reviewed possible side effects, interactions, risks and benefits  3. Follow-up prn if symptoms worsen or don't improve   PDMP not reviewed this encounter.   Norval Gable, MD 07/17/19 [phone]

## 2019-07-19 DIAGNOSIS — D649 Anemia, unspecified: Secondary | ICD-10-CM | POA: Insufficient documentation

## 2019-09-20 ENCOUNTER — Other Ambulatory Visit: Payer: Self-pay

## 2019-09-20 ENCOUNTER — Ambulatory Visit (INDEPENDENT_AMBULATORY_CARE_PROVIDER_SITE_OTHER)
Admission: RE | Admit: 2019-09-20 | Discharge: 2019-09-20 | Disposition: A | Discharge status: Home / Self Care | Payer: Managed Care, Other (non HMO) | Source: Ambulatory Visit

## 2019-09-20 DIAGNOSIS — J3089 Other allergic rhinitis: Secondary | ICD-10-CM

## 2019-09-20 DIAGNOSIS — J392 Other diseases of pharynx: Secondary | ICD-10-CM

## 2019-09-20 MED ORDER — AZELASTINE HCL 0.1 % NA SOLN: 2.0000 | Freq: Two times a day (BID) | NASAL | 0 refills | Status: DC

## 2019-09-20 MED ORDER — LIDOCAINE VISCOUS HCL 2 % MT SOLN: OROMUCOSAL | 0 refills | Status: DC

## 2019-09-20 MED ORDER — AFRIN NASAL SPRAY 0.05 % NA SOLN: 1.0000 | Freq: Two times a day (BID) | NASAL | 0 refills | Status: DC | PRN

## 2019-09-20 NOTE — Discharge Instructions (Addendum)
Start lidocaine for sore throat, do not eat or drink for the next 40 mins after use as it can stunt your gag reflex. Continue flonase, start azelastine for nasal congestion/drainage. Afrin sparingly. You can use over the counter nasal saline rinse such as neti pot for nasal congestion. Keep hydrated, your urine should be clear to pale yellow in color. Tylenol/motrin for fever and pain. Monitor for any worsening of symptoms, chest pain, shortness of breath, wheezing, swelling of the throat, go to the emergency department for further evaluation needed.   [facility] at [facility]) [place], [place], [place] [address] [phone]  [facility] at [facility] [address] Evlyn Courier [place], [place] [address] [phone]  [facility] at [facility] [address] [place], [place] [address] [phone]  [facility] at [place], [place], [place], [place] [address] [phone]  [facility] at [address] [place], [place] [address] [phone]  [facility] at [address], [place],  [address] [phone]

## 2019-09-20 NOTE — ED Provider Notes (Signed)
Virtual Visit via Video Note:  Charlene Gregory  initiated request for Telemedicine visit with [facility] team. I connected with Lauretta Chester  on 09/20/2019 at 11:55 AM  for a synchronized telemedicine visit using a video enabled HIPPA compliant telemedicine application. I verified that I am speaking with Lauretta Chester  using two identifiers. Yelena Metzer Jodell Cipro, PA-C  was physically located in a [facility] Urgent care site and Temekia Sullen was located at a different location.   The limitations of evaluation and management by telemedicine as well as the availability of in-person appointments were discussed. Patient was informed that she  may incur a bill ( including co-pay) for this virtual visit encounter. Stacie Cabot  expressed understanding and gave verbal consent to proceed with virtual visit.     History of Present Illness:Nylah Staloch  is a 34 y.o. female presents with 3 day history of URI symptoms. Had throat irritation, cough s/p endoscopy 09/17/2019 for which she was told was normal. However 2-3 days ago, started having nasal congestion, post nasal drainage. She feels that she is coughing due to drainage/irritation, and at times is productive. Denies fevers, abdominal symptoms, shob, loss of taste/smell. Fully vaccinated, had COVID 04/2019. otc cold without relief. On flonase, xyzal, singulair.     Past Medical History:  Diagnosis Date  . Acid reflux disease   . History of 2019 novel coronavirus disease (COVID-19) 05/13/2019  . Hypertension     No Known Allergies      Observations/Objective: General: Well appearing, nontoxic, no acute distress. Sitting comfortably. Head: Normocephalic, atraumatic Eye: No conjunctival injection, eyelid swelling. EOMI ENT: Mucus membranes moist, no lip cracking. No obvious nasal drainage. Pulm: Speaking in full sentences without difficulty. Normal effort. No respiratory distress, accessory muscle use. Neuro: Normal mental status. Alert and  oriented x 3.  Assessment and Plan: Patient states having trouble using flonase due to amount of congestion. Will start afrin sparingly. flonase and azelastine as directed. Other symptomatic treatments discussed, if symptoms not improving, can contact clinic with possible prednisone to help with symptoms. Return precautions given.  Follow Up Instructions:    I discussed the assessment and treatment plan with the patient. The patient was provided an opportunity to ask questions and all were answered. The patient agreed with the plan and demonstrated an understanding of the instructions.   The patient was advised to call back or seek an in-person evaluation if the symptoms worsen or if the condition fails to improve as anticipated.  I provided 15 minutes of non-face-to-face time during this encounter.    Ok Edwards, PA-C  09/20/2019 11:55 AM         Ok Edwards, PA-C 09/20/19 1155

## 2019-09-24 ENCOUNTER — Telehealth: Payer: Self-pay | Admitting: Emergency Medicine

## 2019-09-24 MED ORDER — PREDNISONE 10 MG PO TABS: 20.0000 mg | ORAL_TABLET | Freq: Every day | ORAL | 0 refills | Status: DC

## 2019-09-24 NOTE — Telephone Encounter (Signed)
Pt called and sts she is not improved; per note from [place] the provider ok to send in RX for prednisone; pt verbalized understanding

## 2019-11-16 ENCOUNTER — Other Ambulatory Visit: Payer: Self-pay

## 2019-11-16 ENCOUNTER — Ambulatory Visit
Admission: RE | Admit: 2019-11-16 | Discharge: 2019-11-16 | Disposition: A | Discharge status: Home / Self Care | Payer: Managed Care, Other (non HMO) | Source: Ambulatory Visit | Attending: Internal Medicine | Admitting: Internal Medicine

## 2019-11-16 VITALS — BP 142/83 | HR 90 | Temp 98.4°F | Resp 16 | Ht 65.0 in | Wt 325.0 lb

## 2019-11-16 DIAGNOSIS — R05 Cough: Secondary | ICD-10-CM

## 2019-11-16 DIAGNOSIS — R059 Cough, unspecified: Secondary | ICD-10-CM

## 2019-11-16 MED ORDER — ALBUTEROL SULFATE HFA 108 (90 BASE) MCG/ACT IN AERS: 2.0000 | INHALATION_SPRAY | RESPIRATORY_TRACT | 0 refills | Status: DC | PRN

## 2019-11-16 NOTE — ED Provider Notes (Signed)
[facility]    CSN: [id number] Arrival date & time: 11/16/19  1354      History   Chief Complaint Chief Complaint  Patient presents with  . Appointment  . Cough    HPI Charlene Gregory is a 34 y.o. female. who presents with a cough x 2 days from turning a sealing fan when she got hot. Has been taking Robitusin expectorants x 2 days and has really bad allergies and takes 2 different allergy meds and nose spray, but she does not feel it has helped her. Has been having  R ear pain  She Noticed when she lays down ? Wheezig. No fever.  She has an inhaler at home > 1y/o she never had to use. She does not feel bad otherwise.    Past Medical History:  Diagnosis Date  . Acid reflux disease   . History of 2019 novel coronavirus disease (COVID-19) 05/13/2019  . Hypertension     There are no problems to display for this patient.   Past Surgical History:  Procedure Laterality Date  . NO PAST SURGERIES      OB History   No obstetric history on file.      Home Medications    Prior to Admission medications   Medication Sig Start Date End Date Taking? Authorizing Provider  amLODipine (NORVASC) 5 MG tablet Take 5 mg by mouth daily. 04/02/19  Yes [provider]  butalbital-acetaminophen-caffeine (FIORICET) 50-325-40 MG tablet Take 1-2 tablets by mouth every 6 (six) hours as needed. 01/23/19  Yes [provider]  fluticasone (FLONASE) 50 MCG/ACT nasal spray Place into both nostrils 2 (two) times daily.   Yes [provider]  hydrochlorothiazide (HYDRODIURIL) 25 MG tablet Take 25 mg by mouth daily. 03/31/19  Yes [provider]  levocetirizine (XYZAL) 5 MG tablet Take 5 mg by mouth every evening.   Yes [provider]  montelukast (SINGULAIR) 10 MG tablet Take 10 mg by mouth at bedtime.   Yes [provider]  albuterol (VENTOLIN HFA) 108 (90 Base) MCG/ACT inhaler Inhale 2 puffs into the lungs every 4 (four) hours as needed  for wheezing or shortness of breath. 11/16/19   Rodriguez-Southworth, Sunday Spillers, PA-C  azelastine (ASTELIN) 0.1 % nasal spray Place 2 sprays into both nostrils 2 (two) times daily. 09/20/19   Tasia Catchings, Amy V, PA-C  lidocaine (XYLOCAINE) 2 % solution 5-15 mL gurgle as needed 09/20/19   Tasia Catchings, Amy V, PA-C  oxymetazoline (AFRIN NASAL SPRAY) 0.05 % nasal spray Place 1 spray into both nostrils 2 (two) times daily as needed for congestion. Do not use for more than 3 days in a row 09/20/19   Tasia Catchings, Amy V, PA-C  pantoprazole (PROTONIX) 20 MG tablet Take 20 mg by mouth 2 (two) times daily.    [provider]  omeprazole (PRILOSEC) 20 MG capsule Take 1 capsule (20 mg total) by mouth daily. 05/24/19 09/20/19  Norval Gable, MD  sucralfate (CARAFATE) 1 g tablet Take 1 tablet (1 g total) by mouth 4 (four) times daily as needed for up to 15 days. 05/27/19 09/20/19  Arta Silence, MD    Family History Family History  Problem Relation Age of Onset  . Healthy Mother   . Cancer Father     Social History Social History   Tobacco Use  . Smoking status: Never Smoker  . Smokeless tobacco: Never Used  Vaping Use  . Vaping Use: Never used  Substance Use Topics  . Alcohol use: Yes  Comment: rarely   . Drug use: Not Currently     Allergies   Patient has no known allergies.   Review of Systems Review of Systems  Constitutional: Negative for activity change, appetite change, chills, diaphoresis, fatigue and fever.  HENT: Positive for congestion, ear pain, postnasal drip and rhinorrhea. Negative for ear discharge, facial swelling, sore throat and trouble swallowing.   Eyes: Negative for discharge.  Respiratory: Positive for cough and wheezing. Negative for shortness of breath.   Musculoskeletal: Negative for gait problem and myalgias.  Skin: Negative for rash.  Allergic/Immunologic: Positive for environmental allergies.  Neurological: Negative for headaches.  Hematological: Negative for adenopathy.    Physical Exam Triage Vital Signs ED Triage Vitals  Enc Vitals Group     BP 11/16/19 1425 (!) 142/83     Pulse Rate 11/16/19 1425 90     Resp 11/16/19 1425 16     Temp 11/16/19 1425 98.4 F (36.9 C)     Temp Source 11/16/19 1425 Oral     SpO2 11/16/19 1425 99 %     Weight 11/16/19 1423 (!) 325 lb (147.4 kg)     Height 11/16/19 1423 5\' 5"  (1.651 m)     Head Circumference --      Peak Flow --      Pain Score 11/16/19 1423 0     Pain Loc --      Pain Edu? --      Excl. in [place]? --    No data found.  Updated Vital Signs BP (!) 142/83 (BP Location: Left Arm)   Pulse 90   Temp 98.4 F (36.9 C) (Oral)   Resp 16   Ht 5\' 5"  (1.651 m)   Wt (!) 325 lb (147.4 kg)   LMP 11/09/2019 (Approximate)   SpO2 99%   BMI 54.08 kg/m   Visual Acuity Right Eye Distance:   Left Eye Distance:   Bilateral Distance:    Right Eye Near:   Left Eye Near:    Bilateral Near:     Physical Exam Vitals and nursing note reviewed.  Constitutional:      General: She is not in acute distress.    Appearance: Normal appearance. She is obese. She is not toxic-appearing.  HENT:     Head: Atraumatic.     Right Ear: Tympanic membrane, ear canal and external ear normal.     Left Ear: Tympanic membrane, ear canal and external ear normal.     Nose: Congestion and rhinorrhea present.     Mouth/Throat:     Mouth: Mucous membranes are moist.     Pharynx: Oropharynx is clear.  Eyes:     General: No scleral icterus.    Conjunctiva/sclera: Conjunctivae normal.  Cardiovascular:     Rate and Rhythm: Normal rate and regular rhythm.  Pulmonary:     Effort: Pulmonary effort is normal.     Breath sounds: Normal breath sounds. No wheezing.  Musculoskeletal:        General: Normal range of motion.     Cervical back: Neck supple.  Skin:    General: Skin is warm and dry.     Findings: No rash.  Neurological:     Mental Status: She is alert and oriented to person, place, and time.     Gait: Gait normal.   Psychiatric:        Mood and Affect: Mood normal.        Behavior: Behavior normal.  Thought Content: Thought content normal.        Judgment: Judgment normal.    UC Treatments / Results  Labs (all labs ordered are listed, but only abnormal results are displayed) Labs Reviewed - No data to display  EKG   Radiology No results found.  Procedures Procedures (including critical care time)  Medications Ordered in UC Medications - No data to display  Initial Impression / Assessment and Plan / UC Course  I have reviewed the triage vital signs and the nursing notes. She is possibly having reactive airway disease from her allergies, so I sent a new Inhaler to her pharmacy to use q 4h prn cough and wheezing. May need to see an allergist since she has not had allergy testing done yet.  Final Clinical Impressions(s) / UC Diagnoses   Final diagnoses:  Cough     Discharge Instructions     Try the inhaler and see if this helps the cough     ED Prescriptions    Medication Sig Dispense Auth. Provider   albuterol (VENTOLIN HFA) 108 (90 Base) MCG/ACT inhaler Inhale 2 puffs into the lungs every 4 (four) hours as needed for wheezing or shortness of breath. 18 g Rodriguez-Southworth, Sunday Spillers, PA-C     PDMP not reviewed this encounter.   Shelby Mattocks, PA-C 11/16/19 1721

## 2019-11-16 NOTE — ED Triage Notes (Signed)
Patient c/o cough and chest congestion and runny nose that started on Thursday.  Patient denies fevers.

## 2019-11-16 NOTE — Discharge Instructions (Addendum)
Try the inhaler and see if this helps the cough

## 2019-11-18 ENCOUNTER — Inpatient Hospital Stay
Admission: RE | Admit: 2019-11-18 | Discharge: 2019-11-18 | Disposition: A | Discharge status: Home / Self Care | Payer: Managed Care, Other (non HMO) | Source: Ambulatory Visit

## 2019-12-03 ENCOUNTER — Other Ambulatory Visit: Payer: Self-pay | Admitting: Internal Medicine

## 2020-02-03 DIAGNOSIS — D500 Iron deficiency anemia secondary to blood loss (chronic): Secondary | ICD-10-CM | POA: Insufficient documentation

## 2020-02-27 ENCOUNTER — Encounter: Payer: Self-pay | Admitting: Intensive Care

## 2020-02-27 ENCOUNTER — Emergency Department: Payer: Managed Care, Other (non HMO)

## 2020-02-27 ENCOUNTER — Other Ambulatory Visit: Payer: Self-pay

## 2020-02-27 ENCOUNTER — Emergency Department
Admission: EM | Admit: 2020-02-27 | Discharge: 2020-02-27 | Disposition: A | Discharge status: Home / Self Care | Payer: Managed Care, Other (non HMO) | Attending: Emergency Medicine | Admitting: Emergency Medicine

## 2020-02-27 DIAGNOSIS — Z79899 Other long term (current) drug therapy: Secondary | ICD-10-CM | POA: Diagnosis not present

## 2020-02-27 DIAGNOSIS — Z8616 Personal history of COVID-19: Secondary | ICD-10-CM | POA: Insufficient documentation

## 2020-02-27 DIAGNOSIS — N839 Noninflammatory disorder of ovary, fallopian tube and broad ligament, unspecified: Secondary | ICD-10-CM | POA: Insufficient documentation

## 2020-02-27 DIAGNOSIS — R1032 Left lower quadrant pain: Secondary | ICD-10-CM | POA: Diagnosis present

## 2020-02-27 DIAGNOSIS — I10 Essential (primary) hypertension: Secondary | ICD-10-CM | POA: Diagnosis not present

## 2020-02-27 DIAGNOSIS — R1030 Lower abdominal pain, unspecified: Secondary | ICD-10-CM

## 2020-02-27 DIAGNOSIS — N838 Other noninflammatory disorders of ovary, fallopian tube and broad ligament: Secondary | ICD-10-CM

## 2020-02-27 LAB — COMPREHENSIVE METABOLIC PANEL
ALT: 14 U/L (ref 0–44)
AST: 13 U/L — ABNORMAL LOW (ref 15–41)
Albumin: 3.7 g/dL (ref 3.5–5.0)
Alkaline Phosphatase: 73 U/L (ref 38–126)
Anion gap: 7 (ref 5–15)
BUN: 11 mg/dL (ref 6–20)
CO2: 27 mmol/L (ref 22–32)
Calcium: 8.8 mg/dL — ABNORMAL LOW (ref 8.9–10.3)
Chloride: 104 mmol/L (ref 98–111)
Creatinine, Ser: 0.66 mg/dL (ref 0.44–1.00)
GFR, Estimated: >60 mL/min (ref >60)
Glucose, Bld: 105 mg/dL — ABNORMAL HIGH (ref 70–99)
Potassium: 3.7 mmol/L (ref 3.5–5.1)
Sodium: 138 mmol/L (ref 135–145)
Total Bilirubin: 0.5 mg/dL (ref 0.3–1.2)
Total Protein: 7.8 g/dL (ref 6.5–8.1)

## 2020-02-27 LAB — CBC
HCT: 34.2 % — ABNORMAL LOW (ref 36.0–46.0)
Hemoglobin: 10.8 g/dL — ABNORMAL LOW (ref 12.0–15.0)
MCH: 20 pg — ABNORMAL LOW (ref 26.0–34.0)
MCHC: 31.6 g/dL (ref 30.0–36.0)
MCV: 63.3 fL — ABNORMAL LOW (ref 80.0–100.0)
Platelets: 206 10*3/uL (ref 150–400)
RBC: 5.4 MIL/uL — ABNORMAL HIGH (ref 3.87–5.11)
RDW: 21.8 % — ABNORMAL HIGH (ref 11.5–15.5)
WBC: 12.4 10*3/uL — ABNORMAL HIGH (ref 4.0–10.5)
nRBC: 0 % (ref 0.0–0.2)

## 2020-02-27 LAB — URINALYSIS, COMPLETE (UACMP) WITH MICROSCOPIC
Bilirubin Urine: NEGATIVE
Glucose, UA: NEGATIVE mg/dL
Hgb urine dipstick: NEGATIVE
Ketones, ur: NEGATIVE mg/dL
Nitrite: NEGATIVE
Protein, ur: NEGATIVE mg/dL
Specific Gravity, Urine: 1.026 (ref 1.005–1.030)
pH: 6 (ref 5.0–8.0)

## 2020-02-27 LAB — LIPASE, BLOOD: Lipase: 24 U/L (ref 11–51)

## 2020-02-27 LAB — POC URINE PREG, ED: Preg Test, Ur: NEGATIVE

## 2020-02-27 MED ORDER — KETOROLAC TROMETHAMINE 30 MG/ML IJ SOLN
30.0000 mg | Freq: Once | INTRAMUSCULAR | Status: AC
  Administered 2020-02-27: 30 mg via INTRAMUSCULAR
  Filled 2020-02-27: qty 1

## 2020-02-27 NOTE — ED Triage Notes (Signed)
C/o abd pain for a few days. C/o mass on left ovary. Currently trying to get pregnant. Denies discharge or bleeding at this time

## 2020-02-27 NOTE — Discharge Instructions (Addendum)
Please call your gynecologist tomorrow for a recheck early next week.  Return to the emergency department for any worsening of symptoms.

## 2020-02-27 NOTE — ED Provider Notes (Signed)
[facility] Emergency Department Provider Note  ____________________________________________  Time seen: Approximately 9:26 PM  I have reviewed the triage vital signs and the nursing notes.   HISTORY  Chief Complaint Abdominal Pain    HPI Charlene Gregory is a 34 y.o. female that presents to the emergency department for evaluation of intermittent left lower quadrant discomfort for 2 months.  Patient had an ultrasound done in October and was told that she has a mass on her left ovary, that is likely causing her discomfort. Discomfort is occasionally associated with intermittent vaginal spotting. She was told by gynecology to come to the emergency department for any pain.  She has not had any discomfort in a couple of days and but returned today.  She had a full pelvic exam with OB/GYN last month, including STD testing. Patient is not concerned for any STDs. She has yearly Pap smears after an abnormal Pap smear several years ago. Patient is trying to get pregnant. No fevers, nausea, vomiting, vaginal discharge, urinary symptoms..   Past Medical History:  Diagnosis Date  . Acid reflux disease   . History of 2019 novel coronavirus disease (COVID-19) 05/13/2019  . Hypertension     There are no problems to display for this patient.   Past Surgical History:  Procedure Laterality Date  . NO PAST SURGERIES      Prior to Admission medications   Medication Sig Start Date End Date Taking? Authorizing Provider  albuterol (VENTOLIN HFA) 108 (90 Base) MCG/ACT inhaler Inhale 2 puffs into the lungs every 4 (four) hours as needed for wheezing or shortness of breath. 11/16/19   Rodriguez-Southworth, Sunday Spillers, PA-C  amLODipine (NORVASC) 5 MG tablet Take 5 mg by mouth daily. 04/02/19   [provider]  azelastine (ASTELIN) 0.1 % nasal spray Place 2 sprays into both nostrils 2 (two) times daily. 09/20/19   Ok Edwards, PA-C  butalbital-acetaminophen-caffeine (FIORICET)  [phone] MG tablet Take 1-2 tablets by mouth every 6 (six) hours as needed. 01/23/19   [provider]  fluticasone (FLONASE) 50 MCG/ACT nasal spray Place into both nostrils 2 (two) times daily.    [provider]  hydrochlorothiazide (HYDRODIURIL) 25 MG tablet Take 25 mg by mouth daily. 03/31/19   [provider]  levocetirizine (XYZAL) 5 MG tablet Take 5 mg by mouth every evening.    [provider]  lidocaine (XYLOCAINE) 2 % solution 5-15 mL gurgle as needed 09/20/19   Tasia Catchings, Amy V, PA-C  montelukast (SINGULAIR) 10 MG tablet Take 10 mg by mouth at bedtime.    [provider]  oxymetazoline (AFRIN NASAL SPRAY) 0.05 % nasal spray Place 1 spray into both nostrils 2 (two) times daily as needed for congestion. Do not use for more than 3 days in a row 09/20/19   Tasia Catchings, Amy V, PA-C  pantoprazole (PROTONIX) 20 MG tablet Take 20 mg by mouth 2 (two) times daily.    [provider]  omeprazole (PRILOSEC) 20 MG capsule Take 1 capsule (20 mg total) by mouth daily. 05/24/19 09/20/19  Norval Gable, MD  sucralfate (CARAFATE) 1 g tablet Take 1 tablet (1 g total) by mouth 4 (four) times daily as needed for up to 15 days. 05/27/19 09/20/19  Arta Silence, MD    Allergies Patient has no known allergies.  Family History  Problem Relation Age of Onset  . Healthy Mother   . Cancer Father     Social History Social History   Tobacco Use  . Smoking  status: Never Smoker  . Smokeless tobacco: Never Used  Vaping Use  . Vaping Use: Never used  Substance Use Topics  . Alcohol use: Yes    Comment: rarely   . Drug use: Not Currently     Review of Systems  Constitutional: No fever/chills ENT: No upper respiratory complaints. Cardiovascular: No chest pain. Respiratory: No cough. No SOB. Gastrointestinal: Positive for intermittent abdominal discomfort.  No nausea, no vomiting.  Musculoskeletal: Negative for musculoskeletal pain. Skin: Negative for rash,  abrasions, lacerations, ecchymosis. Neurological: Negative for headaches, numbness or tingling   ____________________________________________   PHYSICAL EXAM:  VITAL SIGNS: ED Triage Vitals  Enc Vitals Group     BP 02/27/20 1752 (!) 153/86     Pulse Rate 02/27/20 1752 93     Resp 02/27/20 1752 16     Temp 02/27/20 1752 98.5 F (36.9 C)     Temp Source 02/27/20 1752 Oral     SpO2 02/27/20 1752 100 %     Weight 02/27/20 1753 (!) 350 lb (158.8 kg)     Height 02/27/20 1753 5\' 5"  (1.651 m)     Head Circumference --      Peak Flow --      Pain Score 02/27/20 1753 5     Pain Loc --      Pain Edu? --      Excl. in [place]? --      Constitutional: Alert and oriented. Well appearing and in no acute distress. Eyes: Conjunctivae are normal. PERRL. EOMI. Head: Atraumatic. ENT:      Ears:      Nose: No congestion/rhinnorhea.      Mouth/Throat: Mucous membranes are moist.  Neck: No stridor. Cardiovascular: Normal rate, regular rhythm.  Good peripheral circulation. Respiratory: Normal respiratory effort without tachypnea or retractions. Lungs CTAB. Good air entry to the bases with no decreased or absent breath sounds. Gastrointestinal: Bowel sounds 4 quadrants. Soft and nontender to palpation. No guarding or rigidity. No palpable masses. No distention. Musculoskeletal: Full range of motion to all extremities. No gross deformities appreciated. Neurologic:  Normal speech and language. No gross focal neurologic deficits are appreciated.  Skin:  Skin is warm, dry and intact. No rash noted. Psychiatric: Mood and affect are normal. Speech and behavior are normal. Patient exhibits appropriate insight and judgement.   ____________________________________________   LABS (all labs ordered are listed, but only abnormal results are displayed)  Labs Reviewed  COMPREHENSIVE METABOLIC PANEL - Abnormal; Notable for the following components:      Result Value   Glucose, Bld 105 (*)    Calcium 8.8  (*)    AST 13 (*)    All other components within normal limits  CBC - Abnormal; Notable for the following components:   WBC 12.4 (*)    RBC 5.40 (*)    Hemoglobin 10.8 (*)    HCT 34.2 (*)    MCV 63.3 (*)    MCH 20.0 (*)    RDW 21.8 (*)    All other components within normal limits  URINALYSIS, COMPLETE (UACMP) WITH MICROSCOPIC - Abnormal; Notable for the following components:   Color, Urine YELLOW (*)    APPearance CLOUDY (*)    Leukocytes,Ua SMALL (*)    Bacteria, UA RARE (*)    All other components within normal limits  URINE CULTURE  LIPASE, BLOOD  POC URINE PREG, ED   ____________________________________________  EKG   ____________________________________________  RADIOLOGY   US PELVIC COMPLETE W TRANSVAGINAL AND TORSION  R/O  Result Date: 02/27/2020 CLINICAL DATA:  Left lower quadrant pain for 2-3 days EXAM: TRANSABDOMINAL AND TRANSVAGINAL ULTRASOUND OF PELVIS DOPPLER ULTRASOUND OF OVARIES TECHNIQUE: Both transabdominal and transvaginal ultrasound examinations of the pelvis were performed. Transabdominal technique was performed for global imaging of the pelvis including uterus, ovaries, adnexal regions, and pelvic cul-de-sac. It was necessary to proceed with endovaginal exam following the transabdominal exam to visualize the uterus, endometrium, and ovaries. Color and duplex Doppler ultrasound was utilized to evaluate blood flow to the ovaries. COMPARISON:  None. FINDINGS: Uterus Measurements: 7.3 x 4.5 x 5.1 cm = volume: 87 mL. Anteverted uterus. Heterogeneously echogenic and vascular intramural structure along the anterior uterine body measuring approximately 1.7 x 2.3 x 2.1 cm, likely a small intramural fibroid. Trace nonspecific fluid towards the external cervical os. Endometrium Thickness: 12.9 mm, upper limits normal. No focal abnormality visualized. Right ovary Measurements: 5.8 x 5.9 x 3.5 cm = volume: 41 mL. Several anechoic follicles are present in the right ovary with  at least 2 thick-walled cystic foci with internal heterogeneity, the first measuring 2.5 x 1.9 x 2.0 cm with a peripheral rim of color flow which could reflect a corpus luteum or corpus hemorrhagicum. A second more lesion with some heterogeneous internal echogenicity including the mixed solid and cystic appearance measures 2.7 x 1.7 x 2.1 cm in size without significant internal color flow or peripheral vascularity. Could reflect an involuting or hemorrhagic cyst with likely benign though indeterminate characteristics. Left ovary Measurements: 8.7 x 6.2 x 7.8 cm = volume: 228 mL. 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 Due to the extensive shadowing it is difficult to fully visualize the normal ovarian parenchyma but without other gross abnormality in the left adnexa. Pulsed Doppler evaluation of both ovaries demonstrates normal low-resistance arterial and venous waveforms though assessment of the color flow in the left ovary was obtained along the periphery of the heterogeneous mass due to extensive shadowing. Other findings No abnormal free fluid. IMPRESSION: 1. Shadowing in the left adnexa limits Doppler assessment though normal waveforms are seen in what appears to be the periphery of the ovarian parenchyma. Normal Doppler evaluation of the right ovary as well without evidence of torsion. 2. Heterogeneous mass which appears to arise from the left ovary/adnexa with sonographic features suggestive of an ovarian dermoid. [facility] gynecologic consultation for further evaluation. 3. Probable corpus luteum seen in the right ovary measuring up to 2.5 cm while a separate more indeterminate avascular 2.7 cm thick-walled cystic foci in the right ovary may reflect involuting  follicle or hemorrhagic cyst. Could consider 6-12 week sonographic follow-up. Note: This recommendation does not apply to premenarchal patients or to those with increased risk (genetic, family history, elevated tumor markers or other high-risk factors) of ovarian cancer. Reference: Radiology 2019 Nov; 293(2):359-371. 4. Heterogeneously echogenic intramural structure along the anterior uterine body most compatible with intramural fibroid. 5. Trace anechoic fluid about the external cervical os, correlate with clinical features. Electronically Signed   By: Lovena Le M.D.   On: 02/27/2020 21:43    ____________________________________________    PROCEDURES  Procedure(s) performed:    Procedures    Medications  ketorolac (TORADOL) 30 MG/ML injection 30 mg (30 mg Intramuscular Given 02/27/20 2227)     ____________________________________________  INITIAL IMPRESSION / ASSESSMENT AND PLAN / ED COURSE  Pertinent labs & imaging results that were available during my care of the patient were reviewed by me and considered in my medical decision making (see chart for details).  Review of the [place] CSRS was performed in accordance of the [place] prior to dispensing any controlled drugs.    Patient presented to the emergency department for evaluation of left lower quadrant discomfort.  Vital signs and exam are reassuring. Abdomen is soft nontender to palpation. No torsion identified on ultrasound. Patient will discuss ultrasound findings with her gynecologist. See above. Patient is to follow up with gynecology as directed. Patient is given ED precautions to return to the ED for any worsening or new symptoms.  Temitope Flammer was evaluated in Emergency Department on 02/27/2020 for the symptoms described in the history of present illness. She was evaluated in the context of the global COVID-19 pandemic, which necessitated consideration that the patient might be at risk for infection with the SARS-CoV-2  virus that causes COVID-19. Institutional protocols and algorithms that pertain to the evaluation of patients at risk for COVID-19 are in a state of rapid change based on information released by regulatory bodies including the CDC and federal and state organizations. These policies and algorithms were followed during the patient's care in the ED.   ____________________________________________  FINAL CLINICAL IMPRESSION(S) / ED DIAGNOSES  Final diagnoses:  Lower abdominal pain  Left tubo-ovarian mass      NEW MEDICATIONS STARTED DURING THIS VISIT:  ED Discharge Orders    None          This chart was dictated using voice recognition software/Dragon. Despite best efforts to proofread, errors can occur which can change the meaning. Any change was purely unintentional.    Laban Emperor, PA-C 02/27/20 2253    Nance Pear, MD 02/27/20 2318

## 2020-02-27 NOTE — ED Notes (Signed)
Pt reports lower mid-abd pain for "last couple days." Pt describes pain as "pressure, not stabbing or anything like that." Pt denies n/v/d  Pt reports her OBGYN found an mass on her left ovary and told her to come get checked if it caused her pain.   First day of LMP 02/03/2020. Pt reports "lingering spotting" and "light pink when she wipes" especially after intercourse.

## 2020-02-27 NOTE — ED Notes (Signed)
poct pregnancy Negative 

## 2020-02-28 LAB — URINE CULTURE

## 2020-04-25 NOTE — L&D Delivery Note (Signed)
Delivery Note Arrived to room to evaluate for cervical change. Patient was found to be complete and +2 station. Fetal head was delivered. A nuchal was present but unable to be reduced. Shoulders and body followed without difficulty. Infant was placed on mother's abdomen, mouth and nose were bulb suctioned and let out a vigorous cry. Delayed cord clamping was performed for 60 seconds. Venous cord blood was collected. Placenta delivered spontaneously. Cervix, vagina, perineum and labia were inspected and a second degree perineal laceration was noted. Some lower uterine segment atony was noted and was made firm with bimanual massage. TXA 1g IV was administered. The second degree laceration was repaired using 3-0 Vicryl. Uterus fundus firm. Placenta was inspected, found to be intact with a 3 vessel cord. Cytotec 103mg was given per rectum. Counts were correct.   At 10:51 PM a viable and healthy female was delivered via Vaginal, Spontaneous (Presentation: Right Occiput Anterior).  APGAR: 8,9 ; weight: see infant's chart Placenta status: Spontaneous, Intact.  Cord: 3 vessels with the following complications: None.  Cord pH: Not collected  Anesthesia: Epidural Episiotomy: None Lacerations: 2nd degree Suture Repair: 3.0 vicryl Est. Blood Loss (mL): [id number] Mom to postpartum.  Baby to Couplet care / Skin to Skin.  MDrema Dallas9/09/2020, 11:08 PM

## 2020-04-30 ENCOUNTER — Other Ambulatory Visit: Payer: Self-pay

## 2020-04-30 ENCOUNTER — Emergency Department: Payer: Managed Care, Other (non HMO)

## 2020-04-30 ENCOUNTER — Emergency Department
Admission: EM | Admit: 2020-04-30 | Discharge: 2020-04-30 | Disposition: A | Discharge status: Home / Self Care | Payer: Managed Care, Other (non HMO) | Attending: Emergency Medicine | Admitting: Emergency Medicine

## 2020-04-30 DIAGNOSIS — R102 Pelvic and perineal pain: Secondary | ICD-10-CM

## 2020-04-30 DIAGNOSIS — O3481 Maternal care for other abnormalities of pelvic organs, first trimester: Secondary | ICD-10-CM | POA: Diagnosis not present

## 2020-04-30 DIAGNOSIS — Z8616 Personal history of COVID-19: Secondary | ICD-10-CM | POA: Diagnosis not present

## 2020-04-30 DIAGNOSIS — O99611 Diseases of the digestive system complicating pregnancy, first trimester: Secondary | ICD-10-CM | POA: Diagnosis not present

## 2020-04-30 DIAGNOSIS — O10011 Pre-existing essential hypertension complicating pregnancy, first trimester: Secondary | ICD-10-CM | POA: Insufficient documentation

## 2020-04-30 DIAGNOSIS — K219 Gastro-esophageal reflux disease without esophagitis: Secondary | ICD-10-CM | POA: Diagnosis not present

## 2020-04-30 DIAGNOSIS — N83209 Unspecified ovarian cyst, unspecified side: Secondary | ICD-10-CM

## 2020-04-30 DIAGNOSIS — Z3A01 Less than 8 weeks gestation of pregnancy: Secondary | ICD-10-CM | POA: Diagnosis not present

## 2020-04-30 DIAGNOSIS — N83202 Unspecified ovarian cyst, left side: Secondary | ICD-10-CM | POA: Insufficient documentation

## 2020-04-30 DIAGNOSIS — O26891 Other specified pregnancy related conditions, first trimester: Secondary | ICD-10-CM | POA: Diagnosis present

## 2020-04-30 DIAGNOSIS — Z79899 Other long term (current) drug therapy: Secondary | ICD-10-CM | POA: Insufficient documentation

## 2020-04-30 LAB — URINALYSIS, COMPLETE (UACMP) WITH MICROSCOPIC
Bilirubin Urine: NEGATIVE
Glucose, UA: NEGATIVE mg/dL
Hgb urine dipstick: NEGATIVE
Ketones, ur: NEGATIVE mg/dL
Leukocytes,Ua: NEGATIVE
Nitrite: NEGATIVE
Protein, ur: NEGATIVE mg/dL
Specific Gravity, Urine: 1.019 (ref 1.005–1.030)
pH: 5 (ref 5.0–8.0)

## 2020-04-30 LAB — COMPREHENSIVE METABOLIC PANEL
ALT: 15 U/L (ref 0–44)
AST: 14 U/L — ABNORMAL LOW (ref 15–41)
Albumin: 3.8 g/dL (ref 3.5–5.0)
Alkaline Phosphatase: 59 U/L (ref 38–126)
Anion gap: 8 (ref 5–15)
BUN: 11 mg/dL (ref 6–20)
CO2: 24 mmol/L (ref 22–32)
Calcium: 9.2 mg/dL (ref 8.9–10.3)
Chloride: 101 mmol/L (ref 98–111)
Creatinine, Ser: 0.56 mg/dL (ref 0.44–1.00)
GFR, Estimated: >60 mL/min (ref >60)
Glucose, Bld: 93 mg/dL (ref 70–99)
Potassium: 3.5 mmol/L (ref 3.5–5.1)
Sodium: 133 mmol/L — ABNORMAL LOW (ref 135–145)
Total Bilirubin: 0.5 mg/dL (ref 0.3–1.2)
Total Protein: 7.5 g/dL (ref 6.5–8.1)

## 2020-04-30 LAB — CBC
HCT: 36.4 % (ref 36.0–46.0)
Hemoglobin: 11.4 g/dL — ABNORMAL LOW (ref 12.0–15.0)
MCH: 20.8 pg — ABNORMAL LOW (ref 26.0–34.0)
MCHC: 31.3 g/dL (ref 30.0–36.0)
MCV: 66.5 fL — ABNORMAL LOW (ref 80.0–100.0)
Platelets: 227 10*3/uL (ref 150–400)
RBC: 5.47 MIL/uL — ABNORMAL HIGH (ref 3.87–5.11)
RDW: 18.7 % — ABNORMAL HIGH (ref 11.5–15.5)
WBC: 10 10*3/uL (ref 4.0–10.5)
nRBC: 0 % (ref 0.0–0.2)

## 2020-04-30 LAB — HCG, QUANTITATIVE, PREGNANCY: hCG, Beta Chain, Quant, S: 206 m[IU]/mL — ABNORMAL HIGH (ref <5)

## 2020-04-30 LAB — POC URINE PREG, ED: Preg Test, Ur: POSITIVE — AB

## 2020-04-30 NOTE — Discharge Instructions (Addendum)
Please return in 48 hours for repeat beta-hCG and ultrasound. You can take Tylenol for pain.

## 2020-04-30 NOTE — ED Triage Notes (Signed)
PT to ED via POV c/o LLQ abd/pelvic pain starting last night. HX of 8cm L ovarian cyst and her obgyn told her to be seen if it ever becomes painful. PT describes the pain as pressure and stinging.  PT denies vaginal burning, itching or discharge as well as N/V/D.

## 2020-04-30 NOTE — ED Provider Notes (Signed)
[facility]-EMERGENCY DEPARTMENT  ____________________________________________  Time seen: Approximately 5:22 PM  I have reviewed the triage vital signs and the nursing notes.   HISTORY  Chief Complaint Ovarian Cyst   Historian Patient     HPI Charlene Gregory is a 35 y.o. female with a history of a left-sided ovarian cyst presents to the emergency department with left-sided pelvic pressure that is worsened with ambulation and relieved some with rest.  Patient denies nausea and vomiting.  She describes her pain as mostly just uncomfortable.  Ovarian cyst is 8 cm in size according to the patient and is due to be removed by her OB/GYN on February 26th.  Patient denies vaginal bleeding, dysuria, increased urinary frequency or low back pain.  She states that she is in the process of trying to get pregnant and her cycle is 2 to 3 days late and would like a pregnancy test.  She denies nausea or vomiting.  Patient states that she has had similar discomfort in the past.  No other alleviating measures have been attempted.   Past Medical History:  Diagnosis Date  . Acid reflux disease   . History of 2019 novel coronavirus disease (COVID-19) 05/13/2019  . Hypertension      Immunizations up to date:  Yes.     Past Medical History:  Diagnosis Date  . Acid reflux disease   . History of 2019 novel coronavirus disease (COVID-19) 05/13/2019  . Hypertension     There are no problems to display for this patient.   Past Surgical History:  Procedure Laterality Date  . NO PAST SURGERIES      Prior to Admission medications   Medication Sig Start Date End Date Taking? Authorizing Provider  albuterol (VENTOLIN HFA) 108 (90 Base) MCG/ACT inhaler Inhale 2 puffs into the lungs every 4 (four) hours as needed for wheezing or shortness of breath. 11/16/19   Rodriguez-Southworth, Sunday Spillers, PA-C  amLODipine (NORVASC) 5 MG tablet Take 5 mg by mouth daily. 04/02/19   [provider]  azelastine  (ASTELIN) 0.1 % nasal spray Place 2 sprays into both nostrils 2 (two) times daily. 09/20/19   Ok Edwards, PA-C  butalbital-acetaminophen-caffeine (FIORICET) [phone] MG tablet Take 1-2 tablets by mouth every 6 (six) hours as needed. 01/23/19   [provider]  fluticasone (FLONASE) 50 MCG/ACT nasal spray Place into both nostrils 2 (two) times daily.    [provider]  hydrochlorothiazide (HYDRODIURIL) 25 MG tablet Take 25 mg by mouth daily. 03/31/19   [provider]  levocetirizine (XYZAL) 5 MG tablet Take 5 mg by mouth every evening.    [provider]  lidocaine (XYLOCAINE) 2 % solution 5-15 mL gurgle as needed 09/20/19   Tasia Catchings, Amy V, PA-C  montelukast (SINGULAIR) 10 MG tablet Take 10 mg by mouth at bedtime.    [provider]  oxymetazoline (AFRIN NASAL SPRAY) 0.05 % nasal spray Place 1 spray into both nostrils 2 (two) times daily as needed for congestion. Do not use for more than 3 days in a row 09/20/19   Tasia Catchings, Amy V, PA-C  pantoprazole (PROTONIX) 20 MG tablet Take 20 mg by mouth 2 (two) times daily.    [provider]  omeprazole (PRILOSEC) 20 MG capsule Take 1 capsule (20 mg total) by mouth daily. 05/24/19 09/20/19  Norval Gable, MD  sucralfate (CARAFATE) 1 g tablet Take 1 tablet (1 g total) by mouth 4 (four) times daily as needed for up to 15 days. 05/27/19 09/20/19  Siadecki,  Felix Ahmadi, MD    Allergies Patient has no known allergies.  Family History  Problem Relation Age of Onset  . Healthy Mother   . Cancer Father     Social History Social History   Tobacco Use  . Smoking status: Never Smoker  . Smokeless tobacco: Never Used  Vaping Use  . Vaping Use: Never used  Substance Use Topics  . Alcohol use: Yes    Comment: rarely   . Drug use: Not Currently     Review of Systems  Constitutional: No fever/chills Eyes:  No discharge ENT: No upper respiratory complaints. Respiratory: no cough. No SOB/ use of accessory muscles to  breath Gastrointestinal: Patient has left sided pelvic discomfort.  Musculoskeletal: Negative for musculoskeletal pain. Skin: Negative for rash, abrasions, lacerations, ecchymosis.    ____________________________________________   PHYSICAL EXAM:  VITAL SIGNS: ED Triage Vitals  Enc Vitals Group     BP 04/30/20 1550 (!) 151/101     Pulse Rate 04/30/20 1550 91     Resp 04/30/20 1550 18     Temp 04/30/20 1550 98.8 F (37.1 C)     Temp Source 04/30/20 1550 Oral     SpO2 04/30/20 1550 94 %     Weight 04/30/20 1552 (!) 350 lb (158.8 kg)     Height 04/30/20 1552 5\' 5"  (1.651 m)     Head Circumference --      Peak Flow --      Pain Score 04/30/20 1551 7     Pain Loc --      Pain Edu? --      Excl. in [place]? --      Constitutional: Alert and oriented. Well appearing and in no acute distress. Eyes: Conjunctivae are normal. PERRL. EOMI. Head: Atraumatic.  Cardiovascular: Normal rate, regular rhythm. Normal S1 and S2.  Good peripheral circulation. Respiratory: Normal respiratory effort without tachypnea or retractions. Lungs CTAB. Good air entry to the bases with no decreased or absent breath sounds Gastrointestinal: Bowel sounds x 4 quadrants. Soft and nontender to palpation. No guarding or rigidity. No distention. Musculoskeletal: Full range of motion to all extremities. No obvious deformities noted Neurologic:  Normal for age. No gross focal neurologic deficits are appreciated.  Skin:  Skin is warm, dry and intact. No rash noted. Psychiatric: Mood and affect are normal for age. Speech and behavior are normal.   ____________________________________________   LABS (all labs ordered are listed, but only abnormal results are displayed)  Labs Reviewed  COMPREHENSIVE METABOLIC PANEL - Abnormal; Notable for the following components:      Result Value   Sodium 133 (*)    AST 14 (*)    All other components within normal limits  CBC - Abnormal; Notable for the following components:    RBC 5.47 (*)    Hemoglobin 11.4 (*)    MCV 66.5 (*)    MCH 20.8 (*)    RDW 18.7 (*)    All other components within normal limits  URINALYSIS, COMPLETE (UACMP) WITH MICROSCOPIC - Abnormal; Notable for the following components:   Color, Urine YELLOW (*)    APPearance CLEAR (*)    Bacteria, UA RARE (*)    All other components within normal limits  HCG, QUANTITATIVE, PREGNANCY - Abnormal; Notable for the following components:   hCG, Beta Chain, Quant, S 206 (*)    All other components within normal limits  POC URINE PREG, ED - Abnormal; Notable for the following components:   Preg Test, Ur  Positive (*)    All other components within normal limits   ____________________________________________  EKG   ____________________________________________  RADIOLOGY Geraldo Pitter, personally viewed and evaluated these images (plain radiographs) as part of my medical decision making, as well as reviewing the written report by the radiologist.    US OB LESS THAN 14 WEEKS WITH OB TRANSVAGINAL  Result Date: 04/30/2020 CLINICAL DATA:  35 year old female with positive beta hCG and pelvic pain. LMP: 04/01/2020 corresponding to an estimated gestational age of [redacted] weeks, 1 day. EXAM: OBSTETRIC <14 WK [place] AND TRANSVAGINAL OB US TECHNIQUE: Both transabdominal and transvaginal ultrasound examinations were performed for complete evaluation of the gestation as well as the maternal uterus, adnexal regions, and pelvic cul-de-sac. Transvaginal technique was performed to assess early pregnancy. COMPARISON:  Pelvic ultrasound dated 02/27/2020. FINDINGS: The uterus is anteverted and appears unremarkable. The endometrium measures 17 mm in thickness. No intrauterine pregnancy identified. The right ovary is unremarkable and measures 5.2 x 3.8 x 4.3 cm for a volume of 44 cc. The left ovary measures 7.4 x 6.6 x 6.1 cm for a volume of 156 cc. There is a 5.7 x 6.6 x 6.8 cm complex lesion in the region of the left adnexal with  posterior shadowing with sonographic features most consistent with a dermoid. Small to moderate free fluid in the pelvis. IMPRESSION: 1. No intrauterine pregnancy identified and no suspicious adnexal masses noted. Findings consistent with pregnancy of unknown location and differential diagnosis include: An early IUP, recent spontaneous miscarriage, or an occult ectopic pregnancy. Clinical correlation and follow-up with serial HCG levels and repeat ultrasound, as clinically indicated, recommended. 2. Left adnexal dermoid. Electronically Signed   By: Elgie Collard M.D.   On: 04/30/2020 20:11    ____________________________________________    PROCEDURES  Procedure(s) performed:     Procedures     Medications - No data to display   ____________________________________________   INITIAL IMPRESSION / ASSESSMENT AND PLAN / ED COURSE  Pertinent labs & imaging results that were available during my care of the patient were reviewed by me and considered in my medical decision making (see chart for details).  Clinical Course as of 04/30/20 2030  Thu Apr 30, 2020  1941 US OB LESS THAN 14 WEEKS WITH OB TRANSVAGINAL [JW]  1947 US OB LESS THAN 14 WEEKS WITH OB TRANSVAGINAL [JW]    Clinical Course User Index [JW] Orvil Feil, PA-C     Assessment and plan Pelvic pain 35 year old female presents to the emergency department with left-sided pelvic discomfort for the past 2 days.  Patient was hypertensive at triage but vital signs were otherwise reassuring.  On exam, patient was alert, active and nontoxic-appearing with some mild tenderness to palpation along the left lower quadrant.  Differential diagnosis includes ovarian torsion, ovarian cyst, pregnancy...  Beta-hCG was elevated at 206.  Pelvic ultrasound revealed no acute abnormality.  Recommended patient return in 48 hours for repeat beta-hCG and ultrasound.  Patient voiced  understanding.     ____________________________________________  FINAL CLINICAL IMPRESSION(S) / ED DIAGNOSES  Final diagnoses:  Ovarian cyst  Pelvic pain      NEW MEDICATIONS STARTED DURING THIS VISIT:  ED Discharge Orders    None          This chart was dictated using voice recognition software/Dragon. Despite best efforts to proofread, errors can occur which can change the meaning. Any change was purely unintentional.     Orvil Feil, PA-C 04/30/20 2032  Nance Pear, MD 04/30/20 2037

## 2020-05-14 ENCOUNTER — Emergency Department
Admission: EM | Admit: 2020-05-14 | Discharge: 2020-05-14 | Discharge status: Against Medical Advice | Payer: Managed Care, Other (non HMO)

## 2020-05-15 ENCOUNTER — Ambulatory Visit: Payer: Self-pay

## 2020-06-03 LAB — OB RESULTS CONSOLE RPR: RPR: NONREACTIVE

## 2020-06-03 LAB — OB RESULTS CONSOLE GC/CHLAMYDIA
Chlamydia: NEGATIVE
Gonorrhea: NEGATIVE

## 2020-06-03 LAB — OB RESULTS CONSOLE HEPATITIS B SURFACE ANTIGEN: Hepatitis B Surface Ag: NEGATIVE

## 2020-06-03 LAB — OB RESULTS CONSOLE ABO/RH: RH Type: POSITIVE

## 2020-06-03 LAB — OB RESULTS CONSOLE ANTIBODY SCREEN: Antibody Screen: NEGATIVE

## 2020-06-03 LAB — OB RESULTS CONSOLE HIV ANTIBODY (ROUTINE TESTING): HIV: NONREACTIVE

## 2020-06-03 LAB — OB RESULTS CONSOLE RUBELLA ANTIBODY, IGM: Rubella: IMMUNE

## 2020-06-05 ENCOUNTER — Other Ambulatory Visit: Payer: Self-pay | Admitting: Obstetrics and Gynecology

## 2020-06-05 DIAGNOSIS — Z363 Encounter for antenatal screening for malformations: Secondary | ICD-10-CM

## 2020-06-05 DIAGNOSIS — O162 Unspecified maternal hypertension, second trimester: Secondary | ICD-10-CM

## 2020-06-05 DIAGNOSIS — O99210 Obesity complicating pregnancy, unspecified trimester: Secondary | ICD-10-CM

## 2020-08-13 ENCOUNTER — Encounter: Payer: Self-pay | Admitting: *Deleted

## 2020-08-13 ENCOUNTER — Ambulatory Visit: Payer: Managed Care, Other (non HMO) | Attending: Obstetrics and Gynecology

## 2020-08-13 ENCOUNTER — Ambulatory Visit (HOSPITAL_BASED_OUTPATIENT_CLINIC_OR_DEPARTMENT_OTHER): Payer: Managed Care, Other (non HMO) | Admitting: *Deleted

## 2020-08-13 ENCOUNTER — Other Ambulatory Visit: Payer: Self-pay

## 2020-08-13 ENCOUNTER — Other Ambulatory Visit: Payer: Self-pay | Admitting: *Deleted

## 2020-08-13 VITALS — BP 138/73 | HR 95

## 2020-08-13 DIAGNOSIS — E669 Obesity, unspecified: Secondary | ICD-10-CM | POA: Diagnosis not present

## 2020-08-13 DIAGNOSIS — O10912 Unspecified pre-existing hypertension complicating pregnancy, second trimester: Secondary | ICD-10-CM | POA: Diagnosis present

## 2020-08-13 DIAGNOSIS — O10919 Unspecified pre-existing hypertension complicating pregnancy, unspecified trimester: Secondary | ICD-10-CM

## 2020-08-13 DIAGNOSIS — O359XX0 Maternal care for (suspected) fetal abnormality and damage, unspecified, not applicable or unspecified: Secondary | ICD-10-CM | POA: Insufficient documentation

## 2020-08-13 DIAGNOSIS — O99212 Obesity complicating pregnancy, second trimester: Secondary | ICD-10-CM | POA: Insufficient documentation

## 2020-08-13 DIAGNOSIS — Z363 Encounter for antenatal screening for malformations: Secondary | ICD-10-CM | POA: Diagnosis not present

## 2020-08-13 DIAGNOSIS — O99210 Obesity complicating pregnancy, unspecified trimester: Secondary | ICD-10-CM | POA: Diagnosis not present

## 2020-08-13 DIAGNOSIS — O162 Unspecified maternal hypertension, second trimester: Secondary | ICD-10-CM | POA: Insufficient documentation

## 2020-08-13 DIAGNOSIS — Z3A19 19 weeks gestation of pregnancy: Secondary | ICD-10-CM | POA: Insufficient documentation

## 2020-08-13 NOTE — Progress Notes (Signed)
MFM Note  Charlene Gregory was seen for a detailed fetal anatomy scan due to maternal obesity with a BMI of 61.2 and chronic hypertension currently treated with Norvasc.   She denies any other significant past medical history and denies any problems in her current pregnancy.    She had a cell free DNA test earlier in her pregnancy which indicated a low risk for trisomy 22, 17, and 13. A female fetus is predicted.   She was informed that the fetal growth and amniotic fluid level were appropriate for her gestational age.   On today's exam, an intracardiac echogenic focus was noted in the left ventricle of the fetal heart.  The small association between an echogenic focus and Down syndrome was discussed. Due to the echogenic focus noted today, the patient was offered and declined an amniocentesis today for definitive diagnosis of fetal aneuploidy.  She reports that she is comfortable with her negative cell free DNA test.  The views of the fetal anatomy were limited today due to the fetal position and extreme maternal body habitus.  The patient was informed that anomalies may be missed due to technical limitations. If the fetus is in a suboptimal position or maternal habitus is increased, visualization of the fetus in the maternal uterus may be impaired.  The implications and management of chronic hypertension in pregnancy was discussed. The patient was advised that should her blood pressures continue to be elevated, the dosage of her antihypertensive medications may need to be increased.  The increased risk of superimposed preeclampsia, an indicated preterm delivery, and possible fetal growth restriction due to chronic hypertension in pregnancy was discussed. The patient was advised that we will continue to follow her closely throughout her pregnancy. We will continue to follow her with monthly growth scans. Weekly fetal testing should be started at around 32 weeks.   To decrease her risk of superimposed  preeclampsia, she should continue taking a daily baby aspirin (81 mg daily) for preeclampsia prophylaxis.   A follow-up exam was scheduled in 4 weeks to complete the views of the fetal anatomy assess the fetal growth.   A total of 30 minutes was spent counseling and coordinating the care for this patient.  Greater than 50% of the time was spent in direct face-to-face contact.

## 2020-08-16 ENCOUNTER — Other Ambulatory Visit: Payer: Self-pay

## 2020-08-16 ENCOUNTER — Emergency Department
Admission: EM | Admit: 2020-08-16 | Discharge: 2020-08-16 | Disposition: A | Discharge status: Home / Self Care | Payer: Managed Care, Other (non HMO) | Attending: Emergency Medicine | Admitting: Emergency Medicine

## 2020-08-16 DIAGNOSIS — O10012 Pre-existing essential hypertension complicating pregnancy, second trimester: Secondary | ICD-10-CM | POA: Insufficient documentation

## 2020-08-16 DIAGNOSIS — K219 Gastro-esophageal reflux disease without esophagitis: Secondary | ICD-10-CM | POA: Insufficient documentation

## 2020-08-16 DIAGNOSIS — Z79899 Other long term (current) drug therapy: Secondary | ICD-10-CM | POA: Diagnosis not present

## 2020-08-16 DIAGNOSIS — O26892 Other specified pregnancy related conditions, second trimester: Secondary | ICD-10-CM | POA: Diagnosis present

## 2020-08-16 DIAGNOSIS — Z3A19 19 weeks gestation of pregnancy: Secondary | ICD-10-CM | POA: Diagnosis not present

## 2020-08-16 DIAGNOSIS — Z8616 Personal history of COVID-19: Secondary | ICD-10-CM | POA: Diagnosis not present

## 2020-08-16 LAB — CBC WITH DIFFERENTIAL/PLATELET
Abs Immature Granulocytes: 0.09 10*3/uL — ABNORMAL HIGH (ref 0.00–0.07)
Basophils Absolute: 0.1 10*3/uL (ref 0.0–0.1)
Basophils Relative: 1 %
Eosinophils Absolute: 0.2 10*3/uL (ref 0.0–0.5)
Eosinophils Relative: 2 %
HCT: 33.5 % — ABNORMAL LOW (ref 36.0–46.0)
Hemoglobin: 10.9 g/dL — ABNORMAL LOW (ref 12.0–15.0)
Immature Granulocytes: 1 %
Lymphocytes Relative: 21 %
Lymphs Abs: 2.2 10*3/uL (ref 0.7–4.0)
MCH: 21.9 pg — ABNORMAL LOW (ref 26.0–34.0)
MCHC: 32.5 g/dL (ref 30.0–36.0)
MCV: 67.3 fL — ABNORMAL LOW (ref 80.0–100.0)
Monocytes Absolute: 1.4 10*3/uL — ABNORMAL HIGH (ref 0.1–1.0)
Monocytes Relative: 13 %
Neutro Abs: 6.6 10*3/uL (ref 1.7–7.7)
Neutrophils Relative %: 62 %
Platelets: 194 10*3/uL (ref 150–400)
RBC: 4.98 MIL/uL (ref 3.87–5.11)
RDW: 15.9 % — ABNORMAL HIGH (ref 11.5–15.5)
Smear Review: NORMAL
WBC: 10.5 10*3/uL (ref 4.0–10.5)
nRBC: 0 % (ref 0.0–0.2)

## 2020-08-16 LAB — COMPREHENSIVE METABOLIC PANEL
ALT: 24 U/L (ref 0–44)
AST: 27 U/L (ref 15–41)
Albumin: 3.2 g/dL — ABNORMAL LOW (ref 3.5–5.0)
Alkaline Phosphatase: 64 U/L (ref 38–126)
Anion gap: 6 (ref 5–15)
BUN: 8 mg/dL (ref 6–20)
CO2: 22 mmol/L (ref 22–32)
Calcium: 9 mg/dL (ref 8.9–10.3)
Chloride: 105 mmol/L (ref 98–111)
Creatinine, Ser: 0.72 mg/dL (ref 0.44–1.00)
GFR, Estimated: >60 mL/min (ref >60)
Glucose, Bld: 91 mg/dL (ref 70–99)
Potassium: 4.3 mmol/L (ref 3.5–5.1)
Sodium: 133 mmol/L — ABNORMAL LOW (ref 135–145)
Total Bilirubin: 0.4 mg/dL (ref 0.3–1.2)
Total Protein: 7.4 g/dL (ref 6.5–8.1)

## 2020-08-16 LAB — TROPONIN I (HIGH SENSITIVITY): Troponin I (High Sensitivity): <2 ng/L (ref <18)

## 2020-08-16 LAB — LIPASE, BLOOD: Lipase: 29 U/L (ref 11–51)

## 2020-08-16 NOTE — ED Triage Notes (Signed)
Pt states is having central chest pain for a week. Pt states she believes it is acid reflux. Pt states she has been burping and having pain, denies vomiting, shob.

## 2020-08-16 NOTE — ED Provider Notes (Signed)
[facility] Emergency Department Provider Note  ____________________________________________   Event Date/Time   First MD Initiated Contact with Patient 08/16/20 2104     (approximate)  I have reviewed the triage vital signs and the nursing notes.   HISTORY  Chief Complaint Chest Pain    HPI Charlene Gregory is a 35 y.o. female with acid reflux who comes in with concerns for acid reflux.  Patient reports having some burping sensations and centralized chest pain for about a week.  She states that she thinks is acid reflux.  She states it feels like a burning sensation constant, nothing makes it better, worse after eating.  Denies any vomiting, shortness of breath, abdominal pain or any other issues with her pregnancy.  Patient is hypertensive but states that she is off blood pressure medicine for her hypertension and has been compliant with it.  Denies any abdominal pain.  Patient does report that she is [redacted] weeks pregnant and denies any symptoms or concerns in regards to this.  No vaginal discharge, denies any urinary symptoms.  Patient reports that she is in close contact with her OB/GYN and they have prescribed her a PPI that is not helping much.          Past Medical History:  Diagnosis Date  . Acid reflux disease   . Adnexal mass   . Anemia   . Environmental allergies   . Headache   . History of 2019 novel coronavirus disease (COVID-19) 05/13/2019  . HSV-2 infection   . Hypertension   . Morbid obesity due to excess calories ([place])     There are no problems to display for this patient.   Past Surgical History:  Procedure Laterality Date  . COLONOSCOPY W/ POLYPECTOMY    . UPPER GI ENDOSCOPY      Prior to Admission medications   Medication Sig Start Date End Date Taking? Authorizing Provider  albuterol (VENTOLIN HFA) 108 (90 Base) MCG/ACT inhaler Inhale 2 puffs into the lungs every 4 (four) hours as needed for wheezing or shortness of  breath. Patient not taking: Reported on 08/13/2020 11/16/19   Rodriguez-Southworth, Sunday Spillers, PA-C  amLODipine (NORVASC) 5 MG tablet Take 5 mg by mouth daily. 04/02/19   [provider]  aspirin EC 81 MG tablet Take 81 mg by mouth daily. Swallow whole.    [provider]  azelastine (ASTELIN) 0.1 % nasal spray Place 2 sprays into both nostrils 2 (two) times daily. Patient not taking: Reported on 08/13/2020 09/20/19   Ok Edwards, PA-C  butalbital-acetaminophen-caffeine (FIORICET) [phone] MG tablet Take 1-2 tablets by mouth every 6 (six) hours as needed. Patient not taking: Reported on 08/13/2020 01/23/19   [provider]  ferrous sulfate 325 (65 FE) MG tablet Take 325 mg by mouth daily with breakfast. Patient not taking: Reported on 08/13/2020    [provider]  fluticasone (FLONASE) 50 MCG/ACT nasal spray Place into both nostrils 2 (two) times daily.    [provider]  levocetirizine (XYZAL) 5 MG tablet Take 5 mg by mouth every evening.    [provider]  montelukast (SINGULAIR) 10 MG tablet Take 10 mg by mouth at bedtime.    [provider]  pantoprazole (PROTONIX) 20 MG tablet Take 20 mg by mouth 2 (two) times daily. Patient not taking: Reported on 08/13/2020    [provider]  Prenatal Vit-Fe Fumarate-FA (PRENATAL VITAMIN PO) Take by mouth.    [provider]  omeprazole (PRILOSEC) 20  MG capsule Take 1 capsule (20 mg total) by mouth daily. 05/24/19 09/20/19  Norval Gable, MD  sucralfate (CARAFATE) 1 g tablet Take 1 tablet (1 g total) by mouth 4 (four) times daily as needed for up to 15 days. 05/27/19 09/20/19  Arta Silence, MD    Allergies Patient has no known allergies.  Family History  Problem Relation Age of Onset  . Healthy Mother   . Cancer Father     Social History Social History   Tobacco Use  . Smoking status: Never Smoker  . Smokeless tobacco: Never Used  Vaping Use  . Vaping Use: Never  used  Substance Use Topics  . Alcohol use: Not Currently    Comment: rarely   . Drug use: Never      Review of Systems Constitutional: No fever/chills Eyes: No visual changes. ENT: No sore throat. Cardiovascular: Positive chest pain, burning sensation Respiratory: Denies shortness of breath. Gastrointestinal: No abdominal pain.  No nausea, no vomiting.  No diarrhea.  No constipation.  Burping Genitourinary: Negative for dysuria. Musculoskeletal: Negative for back pain. Skin: Negative for rash. Neurological: Negative for headaches, focal weakness or numbness. All other ROS negative ____________________________________________   PHYSICAL EXAM:  VITAL SIGNS: ED Triage Vitals  Enc Vitals Group     BP 08/16/20 2019 (!) 155/92     Pulse Rate 08/16/20 2019 93     Resp 08/16/20 2019 18     Temp 08/16/20 2019 98.4 F (36.9 C)     Temp Source 08/16/20 2019 Oral     SpO2 08/16/20 2019 100 %     Weight 08/16/20 2023 (!) 370 lb (167.8 kg)     Height 08/16/20 2023 5\' 5"  (1.651 m)     Head Circumference --      Peak Flow --      Pain Score 08/16/20 2023 2     Pain Loc --      Pain Edu? --      Excl. in [place]? --     Constitutional: Alert and oriented. Well appearing and in no acute distress. Eyes: Conjunctivae are normal. EOMI. Head: Atraumatic. Nose: No congestion/rhinnorhea. Mouth/Throat: Mucous membranes are moist.   Neck: No stridor. Trachea Midline. FROM Cardiovascular: Normal rate, regular rhythm. Grossly normal heart sounds.  Good peripheral circulation. Respiratory: Normal respiratory effort.  No retractions. Lungs CTAB. Gastrointestinal: Soft and nontender. No distention. No abdominal bruits.  Musculoskeletal: No lower extremity tenderness nor edema.  No joint effusions. Neurologic:  Normal speech and language. No gross focal neurologic deficits are appreciated.  Skin:  Skin is warm, dry and intact. No rash noted. Psychiatric: Mood and affect are normal. Speech and  behavior are normal. GU: Deferred   ____________________________________________   LABS (all labs ordered are listed, but only abnormal results are displayed)  Labs Reviewed  CBC WITH DIFFERENTIAL/PLATELET - Abnormal; Notable for the following components:      Result Value   Hemoglobin 10.9 (*)    HCT 33.5 (*)    MCV 67.3 (*)    MCH 21.9 (*)    RDW 15.9 (*)    Monocytes Absolute 1.4 (*)    Abs Immature Granulocytes 0.09 (*)    All other components within normal limits  COMPREHENSIVE METABOLIC PANEL - Abnormal; Notable for the following components:   Sodium 133 (*)    Albumin 3.2 (*)    All other components within normal limits  LIPASE, BLOOD  TROPONIN I (HIGH SENSITIVITY)   ____________________________________________   ED  ECG REPORT I, Vanessa Bloomingdale, the attending physician, personally viewed and interpreted this ECG.  Normal sinus rate of 91, no ST elevation, no T wave inversions except for lead III, normal intervals  Reviewed prior EKG and the T wave inversion is similar ____________________________________________  PROCEDURES  Procedure(s) performed (including Critical Care):  Procedures   ____________________________________________   INITIAL IMPRESSION / ASSESSMENT AND PLAN / ED COURSE   [place] was evaluated in Emergency Department on 08/16/2020 for the symptoms described in the history of present illness. She was evaluated in the context of the global COVID-19 pandemic, which necessitated consideration that the patient might be at risk for infection with the SARS-CoV-2 virus that causes COVID-19. Institutional protocols and algorithms that pertain to the evaluation of patients at risk for COVID-19 are in a state of rapid change based on information released by regulatory bodies including the CDC and federal and state organizations. These policies and algorithms were followed during the patient's care in the ED.    Most Likely DDx:  -MSK (atypical chest  pain) most likely acid reflux given patient is pregnant  DDx that was also considered d/t potential to cause harm, but was found less likely based on history and physical (as detailed above): -Symptomatic anemia (will get H&H) -Pulmonary embolism as no sob at rest, not pleuritic in nature, no hypoxia -Aortic Dissection as no tearing pain and no radiation to the mid back, pulses equal -Pericarditis no rub on exam, EKG changes or hx to suggest dx -Tamponade (no notable SOB, tachycardic, hypotensive) -Esophageal rupture (no h/o diffuse vomitting/no crepitus)   Patient's work-up is reassuring.  Cardiac markers are negative.  Low suspicion for PE given no shortness of breath not hypoxic.  Lungs are sounds are clear bilaterally I do not think that we need to do a chest x-ray given no symptoms to suggest pneumonia, pneumothorax.  Patient does not have any abdominal pain to suggest gallbladder pathology.  Discussed with patient that she should talk with her OB/GYN for further management.  Patient is reassured that her work-up is otherwise reassuring.  She states that she call her OB/GYN tomorrow in the morning.  I did explain that her blood pressure was elevated here but I do not think it is preeclampsia given its before 20 weeks and is a chronic problem for her.  I discussed the provisional nature of ED diagnosis, the treatment so far, the ongoing plan of care, follow up appointments and return precautions with the patient and any family or support people present. They expressed understanding and agreed with the plan, discharged home.       ____________________________________________   FINAL CLINICAL IMPRESSION(S) / ED DIAGNOSES   Final diagnoses:  Gastroesophageal reflux disease, unspecified whether esophagitis present  [redacted] weeks gestation of pregnancy     MEDICATIONS GIVEN DURING THIS VISIT:  Medications - No data to display   ED Discharge Orders    None       Note:  This  document was prepared using Dragon voice recognition software and may include unintentional dictation errors.   Vanessa Wharton, MD 08/16/20 2138

## 2020-08-16 NOTE — Discharge Instructions (Signed)
There is a lot of conflicting data on what medication should be used for reflux in pregnancy therefore I think would be best to be talk to her OB/GYN to make sure the are aware of what they are prescribing you.  When the potassium is of Carafate is safe in pregnancy.  This helps line the stomach and can help with reflux symptoms.  Please call them tomorrow to get further advice.  Return to the ER if develop worsening symptoms although there were no signs of heart attack or other acute concerns.

## 2020-09-11 ENCOUNTER — Ambulatory Visit: Payer: Managed Care, Other (non HMO) | Admitting: *Deleted

## 2020-09-11 ENCOUNTER — Other Ambulatory Visit: Payer: Self-pay

## 2020-09-11 ENCOUNTER — Ambulatory Visit: Payer: Managed Care, Other (non HMO) | Attending: Obstetrics

## 2020-09-11 ENCOUNTER — Other Ambulatory Visit: Payer: Self-pay | Admitting: Obstetrics

## 2020-09-11 ENCOUNTER — Encounter: Payer: Self-pay | Admitting: *Deleted

## 2020-09-11 VITALS — BP 139/75 | HR 102

## 2020-09-11 DIAGNOSIS — O321XX0 Maternal care for breech presentation, not applicable or unspecified: Secondary | ICD-10-CM

## 2020-09-11 DIAGNOSIS — O10919 Unspecified pre-existing hypertension complicating pregnancy, unspecified trimester: Secondary | ICD-10-CM | POA: Insufficient documentation

## 2020-09-11 DIAGNOSIS — O10012 Pre-existing essential hypertension complicating pregnancy, second trimester: Secondary | ICD-10-CM

## 2020-09-11 DIAGNOSIS — Z363 Encounter for antenatal screening for malformations: Secondary | ICD-10-CM

## 2020-09-11 DIAGNOSIS — O99212 Obesity complicating pregnancy, second trimester: Secondary | ICD-10-CM

## 2020-09-11 DIAGNOSIS — E669 Obesity, unspecified: Secondary | ICD-10-CM

## 2020-09-11 DIAGNOSIS — I10 Essential (primary) hypertension: Secondary | ICD-10-CM | POA: Insufficient documentation

## 2020-09-11 DIAGNOSIS — Z3A23 23 weeks gestation of pregnancy: Secondary | ICD-10-CM

## 2020-10-15 ENCOUNTER — Encounter: Payer: Self-pay | Admitting: *Deleted

## 2020-10-15 ENCOUNTER — Other Ambulatory Visit: Payer: Self-pay | Admitting: *Deleted

## 2020-10-15 ENCOUNTER — Ambulatory Visit: Payer: Managed Care, Other (non HMO) | Admitting: *Deleted

## 2020-10-15 ENCOUNTER — Other Ambulatory Visit: Payer: Self-pay

## 2020-10-15 ENCOUNTER — Ambulatory Visit: Payer: Managed Care, Other (non HMO) | Attending: Obstetrics

## 2020-10-15 DIAGNOSIS — O99212 Obesity complicating pregnancy, second trimester: Secondary | ICD-10-CM | POA: Insufficient documentation

## 2020-10-15 DIAGNOSIS — E669 Obesity, unspecified: Secondary | ICD-10-CM

## 2020-10-15 DIAGNOSIS — Z363 Encounter for antenatal screening for malformations: Secondary | ICD-10-CM

## 2020-10-15 DIAGNOSIS — Z3A28 28 weeks gestation of pregnancy: Secondary | ICD-10-CM

## 2020-10-15 DIAGNOSIS — O10919 Unspecified pre-existing hypertension complicating pregnancy, unspecified trimester: Secondary | ICD-10-CM

## 2020-10-15 DIAGNOSIS — O10013 Pre-existing essential hypertension complicating pregnancy, third trimester: Secondary | ICD-10-CM

## 2020-10-15 DIAGNOSIS — O99213 Obesity complicating pregnancy, third trimester: Secondary | ICD-10-CM | POA: Diagnosis not present

## 2020-10-15 DIAGNOSIS — O10012 Pre-existing essential hypertension complicating pregnancy, second trimester: Secondary | ICD-10-CM | POA: Diagnosis not present

## 2020-11-13 ENCOUNTER — Other Ambulatory Visit: Payer: Self-pay | Admitting: Obstetrics and Gynecology

## 2020-11-13 ENCOUNTER — Ambulatory Visit: Payer: Managed Care, Other (non HMO) | Admitting: *Deleted

## 2020-11-13 ENCOUNTER — Other Ambulatory Visit: Payer: Self-pay | Admitting: Maternal & Fetal Medicine

## 2020-11-13 ENCOUNTER — Ambulatory Visit: Payer: Managed Care, Other (non HMO) | Attending: Obstetrics and Gynecology

## 2020-11-13 ENCOUNTER — Other Ambulatory Visit: Payer: Self-pay

## 2020-11-13 ENCOUNTER — Encounter: Payer: Self-pay | Admitting: *Deleted

## 2020-11-13 VITALS — BP 137/78 | HR 96

## 2020-11-13 DIAGNOSIS — O99213 Obesity complicating pregnancy, third trimester: Secondary | ICD-10-CM

## 2020-11-13 DIAGNOSIS — O10013 Pre-existing essential hypertension complicating pregnancy, third trimester: Secondary | ICD-10-CM | POA: Diagnosis not present

## 2020-11-13 DIAGNOSIS — Z363 Encounter for antenatal screening for malformations: Secondary | ICD-10-CM

## 2020-11-13 DIAGNOSIS — D279 Benign neoplasm of unspecified ovary: Secondary | ICD-10-CM

## 2020-11-13 DIAGNOSIS — O10913 Unspecified pre-existing hypertension complicating pregnancy, third trimester: Secondary | ICD-10-CM

## 2020-11-13 DIAGNOSIS — O10919 Unspecified pre-existing hypertension complicating pregnancy, unspecified trimester: Secondary | ICD-10-CM | POA: Insufficient documentation

## 2020-11-13 DIAGNOSIS — O3483 Maternal care for other abnormalities of pelvic organs, third trimester: Secondary | ICD-10-CM

## 2020-11-13 DIAGNOSIS — O163 Unspecified maternal hypertension, third trimester: Secondary | ICD-10-CM

## 2020-11-13 DIAGNOSIS — Z3A32 32 weeks gestation of pregnancy: Secondary | ICD-10-CM

## 2020-11-20 ENCOUNTER — Other Ambulatory Visit: Payer: Self-pay

## 2020-11-20 ENCOUNTER — Ambulatory Visit: Payer: Managed Care, Other (non HMO) | Attending: Maternal & Fetal Medicine

## 2020-11-20 ENCOUNTER — Ambulatory Visit: Payer: Managed Care, Other (non HMO) | Admitting: *Deleted

## 2020-11-20 DIAGNOSIS — O99213 Obesity complicating pregnancy, third trimester: Secondary | ICD-10-CM | POA: Insufficient documentation

## 2020-11-20 DIAGNOSIS — O3483 Maternal care for other abnormalities of pelvic organs, third trimester: Secondary | ICD-10-CM

## 2020-11-20 DIAGNOSIS — Z3A33 33 weeks gestation of pregnancy: Secondary | ICD-10-CM

## 2020-11-20 DIAGNOSIS — Z363 Encounter for antenatal screening for malformations: Secondary | ICD-10-CM | POA: Diagnosis not present

## 2020-11-20 DIAGNOSIS — O163 Unspecified maternal hypertension, third trimester: Secondary | ICD-10-CM | POA: Insufficient documentation

## 2020-11-20 DIAGNOSIS — O10013 Pre-existing essential hypertension complicating pregnancy, third trimester: Secondary | ICD-10-CM

## 2020-11-20 DIAGNOSIS — D271 Benign neoplasm of left ovary: Secondary | ICD-10-CM

## 2020-11-27 ENCOUNTER — Ambulatory Visit: Payer: Managed Care, Other (non HMO) | Admitting: *Deleted

## 2020-11-27 ENCOUNTER — Other Ambulatory Visit: Payer: Self-pay

## 2020-11-27 ENCOUNTER — Encounter: Payer: Self-pay | Admitting: *Deleted

## 2020-11-27 ENCOUNTER — Ambulatory Visit: Payer: Managed Care, Other (non HMO) | Attending: Maternal & Fetal Medicine

## 2020-11-27 VITALS — BP 133/61 | HR 94

## 2020-11-27 DIAGNOSIS — Z3A34 34 weeks gestation of pregnancy: Secondary | ICD-10-CM

## 2020-11-27 DIAGNOSIS — O163 Unspecified maternal hypertension, third trimester: Secondary | ICD-10-CM | POA: Diagnosis not present

## 2020-11-27 DIAGNOSIS — O3483 Maternal care for other abnormalities of pelvic organs, third trimester: Secondary | ICD-10-CM | POA: Diagnosis not present

## 2020-11-27 DIAGNOSIS — E669 Obesity, unspecified: Secondary | ICD-10-CM | POA: Diagnosis not present

## 2020-11-27 DIAGNOSIS — I10 Essential (primary) hypertension: Secondary | ICD-10-CM | POA: Insufficient documentation

## 2020-11-27 DIAGNOSIS — O99213 Obesity complicating pregnancy, third trimester: Secondary | ICD-10-CM | POA: Diagnosis not present

## 2020-11-27 DIAGNOSIS — D279 Benign neoplasm of unspecified ovary: Secondary | ICD-10-CM

## 2020-11-27 DIAGNOSIS — O10013 Pre-existing essential hypertension complicating pregnancy, third trimester: Secondary | ICD-10-CM

## 2020-12-03 ENCOUNTER — Ambulatory Visit: Payer: Managed Care, Other (non HMO) | Attending: Maternal & Fetal Medicine

## 2020-12-03 ENCOUNTER — Ambulatory Visit: Payer: Managed Care, Other (non HMO) | Admitting: *Deleted

## 2020-12-03 ENCOUNTER — Encounter: Payer: Self-pay | Admitting: *Deleted

## 2020-12-03 ENCOUNTER — Other Ambulatory Visit: Payer: Self-pay

## 2020-12-03 VITALS — BP 136/71 | HR 95

## 2020-12-03 DIAGNOSIS — Z3A35 35 weeks gestation of pregnancy: Secondary | ICD-10-CM

## 2020-12-03 DIAGNOSIS — O163 Unspecified maternal hypertension, third trimester: Secondary | ICD-10-CM | POA: Diagnosis not present

## 2020-12-03 DIAGNOSIS — I10 Essential (primary) hypertension: Secondary | ICD-10-CM | POA: Insufficient documentation

## 2020-12-03 DIAGNOSIS — O10013 Pre-existing essential hypertension complicating pregnancy, third trimester: Secondary | ICD-10-CM | POA: Diagnosis not present

## 2020-12-03 DIAGNOSIS — O99213 Obesity complicating pregnancy, third trimester: Secondary | ICD-10-CM | POA: Insufficient documentation

## 2020-12-03 DIAGNOSIS — D279 Benign neoplasm of unspecified ovary: Secondary | ICD-10-CM

## 2020-12-03 DIAGNOSIS — O3483 Maternal care for other abnormalities of pelvic organs, third trimester: Secondary | ICD-10-CM | POA: Diagnosis not present

## 2020-12-11 ENCOUNTER — Ambulatory Visit: Payer: Managed Care, Other (non HMO) | Attending: Maternal & Fetal Medicine

## 2020-12-11 ENCOUNTER — Ambulatory Visit: Payer: Managed Care, Other (non HMO) | Admitting: *Deleted

## 2020-12-11 ENCOUNTER — Other Ambulatory Visit: Payer: Self-pay

## 2020-12-11 ENCOUNTER — Encounter: Payer: Self-pay | Admitting: *Deleted

## 2020-12-11 VITALS — BP 128/71 | HR 93

## 2020-12-11 DIAGNOSIS — Z3A36 36 weeks gestation of pregnancy: Secondary | ICD-10-CM

## 2020-12-11 DIAGNOSIS — Z363 Encounter for antenatal screening for malformations: Secondary | ICD-10-CM | POA: Diagnosis not present

## 2020-12-11 DIAGNOSIS — O10913 Unspecified pre-existing hypertension complicating pregnancy, third trimester: Secondary | ICD-10-CM

## 2020-12-11 DIAGNOSIS — O99213 Obesity complicating pregnancy, third trimester: Secondary | ICD-10-CM | POA: Diagnosis not present

## 2020-12-11 DIAGNOSIS — O10013 Pre-existing essential hypertension complicating pregnancy, third trimester: Secondary | ICD-10-CM

## 2020-12-11 DIAGNOSIS — O3482 Maternal care for other abnormalities of pelvic organs, second trimester: Secondary | ICD-10-CM

## 2020-12-11 DIAGNOSIS — O163 Unspecified maternal hypertension, third trimester: Secondary | ICD-10-CM | POA: Insufficient documentation

## 2020-12-11 DIAGNOSIS — D271 Benign neoplasm of left ovary: Secondary | ICD-10-CM

## 2020-12-18 ENCOUNTER — Other Ambulatory Visit: Payer: Self-pay

## 2020-12-18 ENCOUNTER — Ambulatory Visit: Payer: Managed Care, Other (non HMO) | Attending: Maternal & Fetal Medicine

## 2020-12-18 ENCOUNTER — Other Ambulatory Visit: Payer: Self-pay | Admitting: Maternal & Fetal Medicine

## 2020-12-18 ENCOUNTER — Encounter: Payer: Self-pay | Admitting: *Deleted

## 2020-12-18 ENCOUNTER — Ambulatory Visit: Payer: Managed Care, Other (non HMO) | Admitting: *Deleted

## 2020-12-18 DIAGNOSIS — O368390 Maternal care for abnormalities of the fetal heart rate or rhythm, unspecified trimester, not applicable or unspecified: Secondary | ICD-10-CM

## 2020-12-18 DIAGNOSIS — O163 Unspecified maternal hypertension, third trimester: Secondary | ICD-10-CM

## 2020-12-18 DIAGNOSIS — D271 Benign neoplasm of left ovary: Secondary | ICD-10-CM

## 2020-12-18 DIAGNOSIS — O99213 Obesity complicating pregnancy, third trimester: Secondary | ICD-10-CM | POA: Insufficient documentation

## 2020-12-18 DIAGNOSIS — O10013 Pre-existing essential hypertension complicating pregnancy, third trimester: Secondary | ICD-10-CM

## 2020-12-18 DIAGNOSIS — Z362 Encounter for other antenatal screening follow-up: Secondary | ICD-10-CM

## 2020-12-18 DIAGNOSIS — O3483 Maternal care for other abnormalities of pelvic organs, third trimester: Secondary | ICD-10-CM

## 2020-12-18 DIAGNOSIS — Z3A37 37 weeks gestation of pregnancy: Secondary | ICD-10-CM

## 2020-12-18 NOTE — Procedures (Signed)
Charlene Gregory 1985-07-22 [redacted]w[redacted]d Fetus A Non-Stress Test Interpretation for 12/18/20  Indication:  Fetal tachycardia(persistent)  Fetal Heart Rate A Mode: External Baseline Rate (A): 150 bpm (FHR baseline started at 170-180 then decreased to 150-160) Variability: Moderate Accelerations: 15 x 15 Decelerations: None Multiple birth?: No  Uterine Activity Mode: Palpation, Toco Contraction Frequency (min): none Resting Tone Palpated: Relaxed  Interpretation (Fetal Testing) Nonstress Test Interpretation: Reactive Overall Impression: Reassuring for gestational age Comments: Dr. SDonalee Citrinreviewed tracing

## 2020-12-24 ENCOUNTER — Telehealth (HOSPITAL_COMMUNITY): Payer: Self-pay | Admitting: *Deleted

## 2020-12-24 NOTE — H&P (Signed)
HPI: 35 y.o. G1P0 @ 70w6destimated gestational age (as dated by LMP c/w 6 week ultrasound) presents for induction of labor for chronic HTN.  Leakage of fluid:  No Vaginal bleeding:  No Contractions:  No Fetal movement:  Yes  [facility]atal [facility]  ROS:  Denies fevers, chills, chest pain, visual changes, SOB, RUQ/epigastric pain, N/V, dysuria, hematuria, or sudden onset/worsening bilateral LE or facial edema.  Pregnancy complicated by: Chronic HTN Left adnexal mass (suspected 10cm dermoid cyst) Obesity (BMI 58) HSV2 LSIL/HRHPV positive pap smear  Desires permanent sterilization  Prenatal Transfer Tool  Maternal Diabetes: No Genetic Screening: Normal Maternal Ultrasounds/Referrals: Normal Fetal Ultrasounds or other Referrals:  None Maternal Substance Abuse:  No Significant Maternal Medications:  Meds include: Other: Amlodipine '5mg'$  daily, ASA '81mg'$  daily, Valtrex '500mg'$  BID Significant Maternal Lab Results: Group B Strep negative   Prenatal Labs Blood type:  O Positive Antibody screen:  Negative CBC:  H/H 10.1/32.0 Rubella: Immune RPR:  Non-reactive Hep B:  Negative Hep C:  Negative HIV:  Negative GC/CT:  Negative Glucola:  93.8 (wnl)  Immunizations: Tdap: Given prenatally Flu: Did not receive  OBHx:  OB History     Gravida  1   Para      Term      Preterm      AB      Living  0      SAB      IAB      Ectopic      Multiple      Live Births             PMHx:  See above Meds:  PNV, Valtrex '500mg'$  BID, Almodipine '5mg'$  qD, ASA '81mg'$  daily Allergy:  No Known Allergies SurgHx: None SocHx:   Denies Tobacco, ETOH, illicit drugs  O: LMP 199991111(Exact Date)  Gen. AAOx3, NAD CV.  RRR  Resp. CTAB, no wheezes/rales/rhonchi Abd. Gravid, soft, non-tender throughout, no rebound/guarding Extr.  Trace bilateral LE edema, no calf tenderness bilaterally SVE: 1/80/-3, cervix soft, posterior sutures  palpated  SSE: No vulvar/vaginal/cervical lesions  Last [place]  [place]  Last growth [place](8/19): EFW 3163g 7lbs ([id number], AAFV, cephalic, posterior placenta  Labs: see orders  A/P:  35 y.o. G1P0 @ 31w6dho presents for induction of labor for chronic HTN.  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

## 2020-12-24 NOTE — Telephone Encounter (Signed)
Preadmission screen  

## 2020-12-25 ENCOUNTER — Telehealth (HOSPITAL_COMMUNITY): Payer: Self-pay | Admitting: *Deleted

## 2020-12-25 ENCOUNTER — Other Ambulatory Visit: Payer: Self-pay

## 2020-12-25 ENCOUNTER — Ambulatory Visit: Payer: Managed Care, Other (non HMO) | Admitting: *Deleted

## 2020-12-25 ENCOUNTER — Encounter (HOSPITAL_COMMUNITY): Payer: Self-pay | Admitting: *Deleted

## 2020-12-25 ENCOUNTER — Ambulatory Visit: Payer: Managed Care, Other (non HMO) | Attending: Maternal & Fetal Medicine

## 2020-12-25 ENCOUNTER — Encounter: Payer: Self-pay | Admitting: *Deleted

## 2020-12-25 DIAGNOSIS — O10919 Unspecified pre-existing hypertension complicating pregnancy, unspecified trimester: Secondary | ICD-10-CM

## 2020-12-25 DIAGNOSIS — B2790 Infectious mononucleosis, unspecified without complication: Secondary | ICD-10-CM | POA: Insufficient documentation

## 2020-12-25 DIAGNOSIS — Z3A38 38 weeks gestation of pregnancy: Secondary | ICD-10-CM | POA: Diagnosis not present

## 2020-12-25 DIAGNOSIS — O10013 Pre-existing essential hypertension complicating pregnancy, third trimester: Secondary | ICD-10-CM

## 2020-12-25 DIAGNOSIS — E669 Obesity, unspecified: Secondary | ICD-10-CM

## 2020-12-25 DIAGNOSIS — O163 Unspecified maternal hypertension, third trimester: Secondary | ICD-10-CM | POA: Diagnosis not present

## 2020-12-25 DIAGNOSIS — B009 Herpesviral infection, unspecified: Secondary | ICD-10-CM

## 2020-12-25 DIAGNOSIS — O99213 Obesity complicating pregnancy, third trimester: Secondary | ICD-10-CM | POA: Diagnosis not present

## 2020-12-25 DIAGNOSIS — B343 Parvovirus infection, unspecified: Secondary | ICD-10-CM | POA: Insufficient documentation

## 2020-12-25 DIAGNOSIS — N9489 Other specified conditions associated with female genital organs and menstrual cycle: Secondary | ICD-10-CM

## 2020-12-25 DIAGNOSIS — E559 Vitamin D deficiency, unspecified: Secondary | ICD-10-CM | POA: Insufficient documentation

## 2020-12-25 DIAGNOSIS — Z6841 Body Mass Index (BMI) 40.0 and over, adult: Secondary | ICD-10-CM | POA: Insufficient documentation

## 2020-12-25 NOTE — Telephone Encounter (Signed)
Preadmission screen  

## 2020-12-29 ENCOUNTER — Inpatient Hospital Stay (HOSPITAL_COMMUNITY): Payer: Managed Care, Other (non HMO)

## 2020-12-29 ENCOUNTER — Encounter (HOSPITAL_COMMUNITY): Payer: Self-pay | Admitting: Obstetrics and Gynecology

## 2020-12-29 ENCOUNTER — Other Ambulatory Visit: Payer: Self-pay

## 2020-12-29 ENCOUNTER — Inpatient Hospital Stay (HOSPITAL_COMMUNITY): Payer: Managed Care, Other (non HMO) | Admitting: Anesthesiology

## 2020-12-29 ENCOUNTER — Inpatient Hospital Stay (HOSPITAL_COMMUNITY)
Admission: AD | Admit: 2020-12-29 | Discharge: 2020-12-31 | DRG: 806 | Disposition: A | Discharge status: Home / Self Care | Payer: Managed Care, Other (non HMO) | Attending: Obstetrics and Gynecology | Admitting: Obstetrics and Gynecology

## 2020-12-29 DIAGNOSIS — N9489 Other specified conditions associated with female genital organs and menstrual cycle: Secondary | ICD-10-CM

## 2020-12-29 DIAGNOSIS — O99214 Obesity complicating childbirth: Secondary | ICD-10-CM | POA: Diagnosis present

## 2020-12-29 DIAGNOSIS — D271 Benign neoplasm of left ovary: Secondary | ICD-10-CM | POA: Diagnosis present

## 2020-12-29 DIAGNOSIS — O9832 Other infections with a predominantly sexual mode of transmission complicating childbirth: Secondary | ICD-10-CM | POA: Diagnosis present

## 2020-12-29 DIAGNOSIS — O99892 Other specified diseases and conditions complicating childbirth: Secondary | ICD-10-CM | POA: Diagnosis present

## 2020-12-29 DIAGNOSIS — Z20822 Contact with and (suspected) exposure to covid-19: Secondary | ICD-10-CM | POA: Diagnosis present

## 2020-12-29 DIAGNOSIS — O1002 Pre-existing essential hypertension complicating childbirth: Principal | ICD-10-CM | POA: Diagnosis present

## 2020-12-29 DIAGNOSIS — A6000 Herpesviral infection of urogenital system, unspecified: Secondary | ICD-10-CM | POA: Diagnosis present

## 2020-12-29 DIAGNOSIS — O10919 Unspecified pre-existing hypertension complicating pregnancy, unspecified trimester: Secondary | ICD-10-CM

## 2020-12-29 DIAGNOSIS — Z3A38 38 weeks gestation of pregnancy: Secondary | ICD-10-CM

## 2020-12-29 DIAGNOSIS — B009 Herpesviral infection, unspecified: Secondary | ICD-10-CM

## 2020-12-29 LAB — CBC
HCT: 32.1 % — ABNORMAL LOW (ref 36.0–46.0)
HCT: 31.6 % — ABNORMAL LOW (ref 36.0–46.0)
Hemoglobin: 10.4 g/dL — ABNORMAL LOW (ref 12.0–15.0)
Hemoglobin: 10.3 g/dL — ABNORMAL LOW (ref 12.0–15.0)
MCH: 22.3 pg — ABNORMAL LOW (ref 26.0–34.0)
MCH: 22.1 pg — ABNORMAL LOW (ref 26.0–34.0)
MCHC: 32.4 g/dL (ref 30.0–36.0)
MCHC: 32.6 g/dL (ref 30.0–36.0)
MCV: 68.7 fL — ABNORMAL LOW (ref 80.0–100.0)
MCV: 67.8 fL — ABNORMAL LOW (ref 80.0–100.0)
Platelets: 180 10*3/uL (ref 150–400)
Platelets: 180 10*3/uL (ref 150–400)
RBC: 4.67 MIL/uL (ref 3.87–5.11)
RBC: 4.66 MIL/uL (ref 3.87–5.11)
RDW: 17.4 % — ABNORMAL HIGH (ref 11.5–15.5)
RDW: 17.2 % — ABNORMAL HIGH (ref 11.5–15.5)
WBC: 12 10*3/uL — ABNORMAL HIGH (ref 4.0–10.5)
WBC: 12 10*3/uL — ABNORMAL HIGH (ref 4.0–10.5)
nRBC: 0 % (ref 0.0–0.2)
nRBC: 0 % (ref 0.0–0.2)

## 2020-12-29 LAB — TYPE AND SCREEN
ABO/RH(D): O POS
Antibody Screen: NEGATIVE

## 2020-12-29 LAB — RESP PANEL BY RT-PCR (FLU A&B, COVID) ARPGX2
Influenza A by PCR: NEGATIVE
Influenza B by PCR: NEGATIVE
SARS Coronavirus 2 by RT PCR: NEGATIVE

## 2020-12-29 LAB — COMPREHENSIVE METABOLIC PANEL
ALT: 19 U/L (ref 0–44)
AST: 17 U/L (ref 15–41)
Albumin: 2.9 g/dL — ABNORMAL LOW (ref 3.5–5.0)
Alkaline Phosphatase: 112 U/L (ref 38–126)
Anion gap: 9 (ref 5–15)
BUN: 9 mg/dL (ref 6–20)
CO2: 19 mmol/L — ABNORMAL LOW (ref 22–32)
Calcium: 9.1 mg/dL (ref 8.9–10.3)
Chloride: 104 mmol/L (ref 98–111)
Creatinine, Ser: 0.52 mg/dL (ref 0.44–1.00)
GFR, Estimated: >60 mL/min (ref >60)
Glucose, Bld: 77 mg/dL (ref 70–99)
Potassium: 3.6 mmol/L (ref 3.5–5.1)
Sodium: 132 mmol/L — ABNORMAL LOW (ref 135–145)
Total Bilirubin: 0.3 mg/dL (ref 0.3–1.2)
Total Protein: 7 g/dL (ref 6.5–8.1)

## 2020-12-29 LAB — PROTEIN / CREATININE RATIO, URINE
Creatinine, Urine: 40.13 mg/dL
Total Protein, Urine: <6 mg/dL

## 2020-12-29 LAB — LACTATE DEHYDROGENASE: LDH: 116 U/L (ref 98–192)

## 2020-12-29 LAB — RPR: RPR Ser Ql: NONREACTIVE

## 2020-12-29 MED ORDER — LIDOCAINE HCL (PF) 1 % IJ SOLN
INTRAMUSCULAR | Status: DC | PRN
  Administered 2020-12-29 (×2): 4 mL via EPIDURAL

## 2020-12-29 MED ORDER — TERBUTALINE SULFATE 1 MG/ML IJ SOLN: 0.2500 mg | Freq: Once | INTRAMUSCULAR | Status: DC | PRN

## 2020-12-29 MED ORDER — OXYCODONE-ACETAMINOPHEN 5-325 MG PO TABS: 2.0000 | ORAL_TABLET | ORAL | Status: DC | PRN

## 2020-12-29 MED ORDER — OXYTOCIN-SODIUM CHLORIDE 30-0.9 UT/500ML-% IV SOLN
1.0000 m[IU]/min | INTRAVENOUS | Status: DC
  Administered 2020-12-29: 2 m[IU]/min via INTRAVENOUS
  Filled 2020-12-29: qty 500

## 2020-12-29 MED ORDER — PHENYLEPHRINE 40 MCG/ML (10ML) SYRINGE FOR IV PUSH (FOR BLOOD PRESSURE SUPPORT)
80.0000 ug | PREFILLED_SYRINGE | INTRAVENOUS | Status: DC | PRN
  Administered 2020-12-29: 80 ug via INTRAVENOUS

## 2020-12-29 MED ORDER — ONDANSETRON HCL 4 MG/2ML IJ SOLN: 4.0000 mg | Freq: Four times a day (QID) | INTRAMUSCULAR | Status: DC | PRN

## 2020-12-29 MED ORDER — MISOPROSTOL 200 MCG PO TABS
ORAL_TABLET | ORAL | Status: AC
  Administered 2020-12-29: 1000 ug via RECTAL
  Filled 2020-12-29: qty 5

## 2020-12-29 MED ORDER — AMLODIPINE BESYLATE 5 MG PO TABS
5.0000 mg | ORAL_TABLET | Freq: Every day | ORAL | Status: DC
  Administered 2020-12-30 – 2020-12-31 (×2): 5 mg via ORAL
  Filled 2020-12-29 (×3): qty 1

## 2020-12-29 MED ORDER — PHENYLEPHRINE 40 MCG/ML (10ML) SYRINGE FOR IV PUSH (FOR BLOOD PRESSURE SUPPORT)
80.0000 ug | PREFILLED_SYRINGE | INTRAVENOUS | Status: DC | PRN
  Filled 2020-12-29: qty 10

## 2020-12-29 MED ORDER — OXYCODONE-ACETAMINOPHEN 5-325 MG PO TABS: 1.0000 | ORAL_TABLET | ORAL | Status: DC | PRN

## 2020-12-29 MED ORDER — MISOPROSTOL 200 MCG PO TABS: 1000.0000 ug | ORAL_TABLET | Freq: Once | ORAL | Status: AC

## 2020-12-29 MED ORDER — SODIUM BICARBONATE 8.4 % IV SOLN
INTRAVENOUS | Status: DC | PRN
  Administered 2020-12-29 (×4): 5 mL via EPIDURAL

## 2020-12-29 MED ORDER — SOD CITRATE-CITRIC ACID 500-334 MG/5ML PO SOLN: 30.0000 mL | ORAL | Status: DC | PRN

## 2020-12-29 MED ORDER — LACTATED RINGERS IV SOLN: INTRAVENOUS | Status: DC

## 2020-12-29 MED ORDER — LACTATED RINGERS IV SOLN: 500.0000 mL | INTRAVENOUS | Status: DC | PRN

## 2020-12-29 MED ORDER — FENTANYL CITRATE (PF) 100 MCG/2ML IJ SOLN
50.0000 ug | INTRAMUSCULAR | Status: DC | PRN
  Administered 2020-12-29: 50 ug via INTRAVENOUS
  Filled 2020-12-29: qty 2

## 2020-12-29 MED ORDER — HYDROXYZINE HCL 50 MG PO TABS: 50.0000 mg | ORAL_TABLET | Freq: Four times a day (QID) | ORAL | Status: DC | PRN

## 2020-12-29 MED ORDER — EPHEDRINE 5 MG/ML INJ: 10.0000 mg | INTRAVENOUS | Status: DC | PRN

## 2020-12-29 MED ORDER — ACETAMINOPHEN 325 MG PO TABS: 650.0000 mg | ORAL_TABLET | ORAL | Status: DC | PRN

## 2020-12-29 MED ORDER — TRANEXAMIC ACID-NACL 1000-0.7 MG/100ML-% IV SOLN: 1000.0000 mg | Freq: Once | INTRAVENOUS | Status: AC

## 2020-12-29 MED ORDER — LIDOCAINE HCL (PF) 1 % IJ SOLN: 30.0000 mL | INTRAMUSCULAR | Status: DC | PRN

## 2020-12-29 MED ORDER — DIPHENHYDRAMINE HCL 50 MG/ML IJ SOLN
12.5000 mg | INTRAMUSCULAR | Status: DC | PRN
Start: 2020-12-29 — End: 2020-12-30

## 2020-12-29 MED ORDER — FENTANYL-BUPIVACAINE-NACL 0.5-0.125-0.9 MG/250ML-% EP SOLN
12.0000 mL/h | EPIDURAL | Status: DC | PRN
  Administered 2020-12-29: 12 mL/h via EPIDURAL
  Filled 2020-12-29: qty 250

## 2020-12-29 MED ORDER — OXYTOCIN BOLUS FROM INFUSION
333.0000 mL | Freq: Once | INTRAVENOUS | Status: AC
  Administered 2020-12-29: 333 mL via INTRAVENOUS

## 2020-12-29 MED ORDER — LACTATED RINGERS IV SOLN
500.0000 mL | Freq: Once | INTRAVENOUS | Status: AC
  Administered 2020-12-29: 500 mL via INTRAVENOUS

## 2020-12-29 MED ORDER — MISOPROSTOL 25 MCG QUARTER TABLET
25.0000 ug | ORAL_TABLET | ORAL | Status: DC | PRN
  Administered 2020-12-29 (×3): 25 ug via VAGINAL
  Filled 2020-12-29 (×3): qty 1

## 2020-12-29 MED ORDER — TRANEXAMIC ACID-NACL 1000-0.7 MG/100ML-% IV SOLN
INTRAVENOUS | Status: AC
  Administered 2020-12-29: 1000 mg via INTRAVENOUS
  Filled 2020-12-29: qty 100

## 2020-12-29 MED ORDER — OXYTOCIN-SODIUM CHLORIDE 30-0.9 UT/500ML-% IV SOLN
2.5000 [IU]/h | INTRAVENOUS | Status: DC
  Administered 2020-12-29: 2.5 [IU]/h via INTRAVENOUS

## 2020-12-29 NOTE — Anesthesia Preprocedure Evaluation (Addendum)
Anesthesia Evaluation  Patient identified by MRN, date of birth, ID band Patient awake    Reviewed: Allergy & Precautions, Patient's Chart, lab work & pertinent test results  Airway Mallampati: III  TM Distance: >3 FB Neck ROM: Full    Dental no notable dental hx. (+) Teeth Intact, Dental Advisory Given   Pulmonary neg pulmonary ROS,    Pulmonary exam normal breath sounds clear to auscultation       Cardiovascular hypertension, Pt. on medications Normal cardiovascular exam Rhythm:Regular Rate:Normal     Neuro/Psych  Headaches, negative psych ROS   GI/Hepatic Neg liver ROS, GERD  Medicated and Controlled,  Endo/Other  Morbid obesity  Renal/GU negative Renal ROS  negative genitourinary   Musculoskeletal negative musculoskeletal ROS (+)   Abdominal (+) + obese,   Peds  Hematology  (+) anemia ,   Anesthesia Other Findings   Reproductive/Obstetrics (+) Pregnancy                            Anesthesia Physical Anesthesia Plan  ASA: 3  Anesthesia Plan: Epidural   Post-op Pain Management:    Induction:   PONV Risk Score and Plan: 2 and Treatment may vary due to age or medical condition  Airway Management Planned: Natural Airway  Additional Equipment:   Intra-op Plan:   Post-operative Plan:   Informed Consent: I have reviewed the patients History and Physical, chart, labs and discussed the procedure including the risks, benefits and alternatives for the proposed anesthesia with the patient or authorized representative who has indicated his/her understanding and acceptance.       Plan Discussed with: Anesthesiologist  Anesthesia Plan Comments:         Anesthesia Quick Evaluation

## 2020-12-29 NOTE — Anesthesia Procedure Notes (Signed)
Epidural Patient location during procedure: OB Start time: 12/29/2020 6:05 PM End time: 12/29/2020 6:15 PM  Staffing Anesthesiologist: Josephine Igo, MD Performed: anesthesiologist   Preanesthetic Checklist Completed: patient identified, IV checked, site marked, risks and benefits discussed, surgical consent, monitors and equipment checked, pre-op evaluation and timeout performed  Epidural Patient position: sitting Prep: DuraPrep and site prepped and draped Patient monitoring: continuous pulse ox and blood pressure Approach: midline Location: L4-L5 Injection technique: LOR air  Needle:  Needle type: Tuohy  Needle gauge: 17 G Needle length: 9 cm and 9 Needle insertion depth: 5 cm and 12 cm Catheter type: closed end flexible Catheter size: 19 Gauge Catheter at skin depth: 10 and 18 cm Test dose: negative and Other  Assessment Events: blood not aspirated, injection not painful, no injection resistance, no paresthesia and negative IV test  Additional Notes Patient identified. Risks and benefits discussed including failed block, incomplete  Pain control, post dural puncture headache, nerve damage, paralysis, blood pressure Changes, nausea, vomiting, reactions to medications-both toxic and allergic and post Partum back pain. All questions were answered. Patient expressed understanding and wished to proceed. Sterile technique was used throughout procedure. Epidural site was Dressed with sterile barrier dressing. No paresthesias, signs of intravascular injection Or signs of intrathecal spread were encountered. Difficult due to Super MO. Attempt x 2. Patient was more comfortable after the epidural was dosed. Please see RN's note for documentation of vital signs and FHR which are stable. Reason for block:procedure for pain

## 2020-12-29 NOTE — Progress Notes (Signed)
OB Progress Note  S: Patient resting comfortably. Consents to FB placement.  O: BP 137/71   Pulse 87   Temp 98.6 F (37 C) (Oral)   Resp 17   Ht '5\' 4"'$  (1.626 m)   Wt (!) 187.2 kg   LMP 04/01/2020 (Exact Date)   BMI 70.86 kg/m   FHT: 155bpm, moderate variablity, + accels, - decels Toco: irregular SVE: 1.5/80/-3, sutures palpable  A/P: 35 y.o. G1P0 @ 43w6dadmitted for induction of labor for chronic HTN.  FWB: Cat. I Labor course: S/p Cytotec x 3 doses vaginally, FB placed Pain: Per patient request GBS: Negative Anticipate SVD  MDrema Dallas DO

## 2020-12-29 NOTE — Progress Notes (Signed)
OB Progress Note  S: In to evaluate patient for recurrent early decelerations. Patient is not comfortable with her epidural. Feeling her contractions.  O: BP (!) 151/70   Pulse 78   Temp 98.8 F (37.1 C) (Oral)   Resp 16   Ht '5\' 4"'$  (1.626 m)   Wt (!) 187.2 kg   LMP 04/01/2020 (Exact Date)   SpO2 99%   BMI 70.86 kg/m   FHT: 145bpm, moderate variablity, + accels, recurrent early decels Toco: q1-1.5 minutes SVE: 7/90/-1, sutures palpated  A/P: 35 y.o. G1P0 @ 42w6dadmitted for induction of labor for chronic HTN.  FWB: Cat. I Labor course: S/p Cytotec x 3 doses vaginally, s/p FB, Pitocin at 675mmin Pain: Epidural placed - does not currently have adequate pain control GBS: Negative Anticipate SVD  MeDrema DallasDO

## 2020-12-29 NOTE — Anesthesia Procedure Notes (Signed)
Epidural Patient location during procedure: OB Start time: 12/29/2020 7:37 PM End time: 12/29/2020 7:45 PM  Staffing Anesthesiologist: Josephine Igo, MD Performed: anesthesiologist   Preanesthetic Checklist Completed: patient identified, IV checked, site marked, risks and benefits discussed, surgical consent, monitors and equipment checked, pre-op evaluation and timeout performed  Epidural Patient position: sitting Prep: DuraPrep and site prepped and draped Patient monitoring: continuous pulse ox and blood pressure Approach: midline Location: L3-L4 Injection technique: LOR air  Needle:  Needle type: Tuohy  Needle gauge: 17 G Needle length: 9 cm and 9 Needle insertion depth: 9 cm Catheter type: closed end flexible Catheter size: 19 Gauge Catheter at skin depth: 15 cm Test dose: negative and Other  Assessment Events: blood not aspirated, injection not painful, no injection resistance, no paresthesia and negative IV test  Additional Notes Patient identified. Risks and benefits discussed including failed block, incomplete  Pain control, post dural puncture headache, nerve damage, paralysis, blood pressure Changes, nausea, vomiting, reactions to medications-both toxic and allergic and post Partum back pain. All questions were answered. Patient expressed understanding and wished to proceed. Sterile technique was used throughout procedure. Epidural site was Dressed with sterile barrier dressing. No paresthesias, signs of intravascular injection Or signs of intrathecal spread were encountered.  Patient was more comfortable after the epidural was dosed. Please see RN's note for documentation of vital signs and FHR which are stable. Reason for block:procedure for pain

## 2020-12-29 NOTE — Progress Notes (Signed)
OB Progress Note  S: Patient resting comfortably. Consents to AROM.  O: BP 135/71   Pulse 80   Temp 98.6 F (37 C) (Oral)   Resp 16   Ht '5\' 4"'$  (1.626 m)   Wt (!) 187.2 kg   LMP 04/01/2020 (Exact Date)   BMI 70.86 kg/m   FHT: 145bpm, moderate variablity, + accels, - decels Toco: q1-1.5 minutes SVE: 6-7/90/-2, sutures palpated  A/P: 35 y.o. G1P0 @ 68w6dadmitted for induction of labor for chronic HTN.  FWB: Cat. I Labor course: S/p Cytotec x 3 doses vaginally, s/p FB, Pitocin at 670mmin Pain: Per patient request, desires epidural now GBS: Negative Anticipate SVD  MeDrema DallasDO

## 2020-12-29 NOTE — Progress Notes (Signed)
OB Progress Note  S: Patient resting comfortably in bedside chair. Feeling only mild cramping.  O: BP 131/62   Pulse 90   Temp 98.6 F (37 C) (Oral)   Resp 17   Ht '5\' 4"'$  (1.626 m)   Wt (!) 187.2 kg   LMP 04/01/2020 (Exact Date)   BMI 70.86 kg/m   FHT: 145bpm, moderate variablity, + accels, - decels Toco: irregular SVE: deferred, 1/30/-3 at 0515 by primary RN  A/P: 35 y.o. G1P0 @ 65w6dadmitted for induction of labor for chronic HTN.  FWB: Cat. I Labor course: S/p Cytotec x 2 doses vaginally Pain: Per patient request GBS: Negative Anticipate SVD  MDrema Dallas DO

## 2020-12-29 NOTE — Progress Notes (Signed)
OB Progress Note  S: Patient had epidural replaced and feels much more comfortable. Not really feeling any pressure.  O: BP (!) 113/14   Pulse (!) 136   Temp 98.8 F (37.1 C) (Oral)   Resp 18   Ht '5\' 4"'$  (1.626 m)   Wt (!) 187.2 kg   LMP 04/01/2020 (Exact Date)   SpO2 92%   BMI 70.86 kg/m   FHT: 150bpm, moderate variablity, - accel, - decel Toco: q1-80mnutes SVE: complete/0, cervix very easily reducible.  A/P: 35y.o. G1P0 @ 352w6ddmitted for induction of labor for chronic HTN.  FWB: Cat. I Labor course: S/p Cytotec x 3 doses vaginally, s/p FB, Pitocin at 1074min Pain: Epidural  GBS: Negative Anticipate SVD  Practice pushed with primary RNs without fetal descent. Will allow to labor down for 30-45 mins and re-evaluate for pushing.   MelDrema DallasO

## 2020-12-30 ENCOUNTER — Encounter (HOSPITAL_COMMUNITY): Payer: Self-pay | Admitting: Obstetrics and Gynecology

## 2020-12-30 LAB — CBC
HCT: 29 % — ABNORMAL LOW (ref 36.0–46.0)
HCT: 28.1 % — ABNORMAL LOW (ref 36.0–46.0)
Hemoglobin: 9.3 g/dL — ABNORMAL LOW (ref 12.0–15.0)
Hemoglobin: 9.2 g/dL — ABNORMAL LOW (ref 12.0–15.0)
MCH: 22 pg — ABNORMAL LOW (ref 26.0–34.0)
MCH: 22.3 pg — ABNORMAL LOW (ref 26.0–34.0)
MCHC: 32.1 g/dL (ref 30.0–36.0)
MCHC: 32.7 g/dL (ref 30.0–36.0)
MCV: 68.6 fL — ABNORMAL LOW (ref 80.0–100.0)
MCV: 68.2 fL — ABNORMAL LOW (ref 80.0–100.0)
Platelets: 177 10*3/uL (ref 150–400)
Platelets: 177 10*3/uL (ref 150–400)
RBC: 4.23 MIL/uL (ref 3.87–5.11)
RBC: 4.12 MIL/uL (ref 3.87–5.11)
RDW: 17.2 % — ABNORMAL HIGH (ref 11.5–15.5)
RDW: 17.2 % — ABNORMAL HIGH (ref 11.5–15.5)
WBC: 15.8 10*3/uL — ABNORMAL HIGH (ref 4.0–10.5)
WBC: 15.2 10*3/uL — ABNORMAL HIGH (ref 4.0–10.5)
nRBC: 0 % (ref 0.0–0.2)
nRBC: 0 % (ref 0.0–0.2)

## 2020-12-30 MED ORDER — DIPHENHYDRAMINE HCL 25 MG PO CAPS: 25.0000 mg | ORAL_CAPSULE | Freq: Four times a day (QID) | ORAL | Status: DC | PRN

## 2020-12-30 MED ORDER — IBUPROFEN 600 MG PO TABS
600.0000 mg | ORAL_TABLET | Freq: Four times a day (QID) | ORAL | Status: DC
  Administered 2020-12-30 – 2020-12-31 (×5): 600 mg via ORAL
  Filled 2020-12-30 (×5): qty 1

## 2020-12-30 MED ORDER — SENNOSIDES-DOCUSATE SODIUM 8.6-50 MG PO TABS
2.0000 | ORAL_TABLET | Freq: Every day | ORAL | Status: DC
  Administered 2020-12-30 – 2020-12-31 (×2): 2 via ORAL
  Filled 2020-12-30 (×2): qty 2

## 2020-12-30 MED ORDER — PRENATAL MULTIVITAMIN CH
1.0000 | ORAL_TABLET | Freq: Every day | ORAL | Status: DC
  Administered 2020-12-30: 1 via ORAL
  Filled 2020-12-30: qty 1

## 2020-12-30 MED ORDER — SIMETHICONE 80 MG PO CHEW: 80.0000 mg | CHEWABLE_TABLET | ORAL | Status: DC | PRN

## 2020-12-30 MED ORDER — ACETAMINOPHEN 325 MG PO TABS
650.0000 mg | ORAL_TABLET | ORAL | Status: DC | PRN
  Administered 2020-12-30: 650 mg via ORAL
  Filled 2020-12-30: qty 2

## 2020-12-30 MED ORDER — IBUPROFEN 600 MG PO TABS: 600.0000 mg | ORAL_TABLET | Freq: Four times a day (QID) | ORAL | 3 refills | Status: DC | PRN

## 2020-12-30 MED ORDER — ONDANSETRON HCL 4 MG PO TABS: 4.0000 mg | ORAL_TABLET | ORAL | Status: DC | PRN

## 2020-12-30 MED ORDER — DIBUCAINE (PERIANAL) 1 % EX OINT: 1.0000 "application " | TOPICAL_OINTMENT | CUTANEOUS | Status: DC | PRN

## 2020-12-30 MED ORDER — BENZOCAINE-MENTHOL 20-0.5 % EX AERO
1.0000 "application " | INHALATION_SPRAY | CUTANEOUS | Status: DC | PRN
  Administered 2020-12-31: 1 via TOPICAL
  Filled 2020-12-30: qty 56

## 2020-12-30 MED ORDER — WITCH HAZEL-GLYCERIN EX PADS: 1.0000 "application " | MEDICATED_PAD | CUTANEOUS | Status: DC | PRN

## 2020-12-30 MED ORDER — ZOLPIDEM TARTRATE 5 MG PO TABS: 5.0000 mg | ORAL_TABLET | Freq: Every evening | ORAL | Status: DC | PRN

## 2020-12-30 MED ORDER — COCONUT OIL OIL: 1.0000 "application " | TOPICAL_OIL | Status: DC | PRN

## 2020-12-30 MED ORDER — ONDANSETRON HCL 4 MG/2ML IJ SOLN: 4.0000 mg | INTRAMUSCULAR | Status: DC | PRN

## 2020-12-30 NOTE — Progress Notes (Signed)
Postpartum Note Day # 1  S:  Patient doing well.  Pain controlled.  Tolerating regular diet.   Ambulating and voiding without difficulty.   Denies fevers, chills, chest pain, SOB, N/V, or worsening bilateral LE edema.  Lochia: Minimal Infant feeding:  Breast Circumcision:  N/A, female infant Contraception:  Desires interval bilateral salpingectomy  O: Temp:  [98.4 F (36.9 C)-99.4 F (37.4 C)] 99 F (37.2 C) (09/07 1400) Pulse Rate:  [72-179] 89 (09/07 1400) Resp:  [16-20] 20 (09/07 1400) BP: (84-169)/(14-112) 100/77 (09/07 1400) SpO2:  [92 %-100 %] 99 % (09/07 0607) Gen: NAD, pleasant and cooperative Resp: CTAB, no wheezes/rales/rhonchi Abdomen: soft, non-distended, non-tender throughout Uterus: firm, non-tender, below umbilicus Ext: Trace bilateral LE edema, no bilateral calf tenderness, SCDs on and working  Labs:  Recent Labs    12/30/20 0118 12/30/20 0531  HGB 9.3* 9.2*    A/P: Patient is a 35 y.o. G1P1001 PPD#1 s/p SVD.  S/p SVD - Pain well controlled  - GU: UOP is adequate - GI: Tolerating regular diet - Activity: encouraged sitting up to chair and ambulation as tolerated - DVT Prophylaxis: Frequent ambulation - Labs: stable as above  Chronic HTN - Medications: Amlodipine '5mg'$  daily (ordered) - Normotensive postpartum - 1 week BP check in the office already arranged  Disposition:  D/C home tomorrow.   Drema Dallas, DO [phone] (office)

## 2020-12-30 NOTE — Anesthesia Postprocedure Evaluation (Signed)
Anesthesia Post Note  Patient: Kahlia Gruenberg  Procedure(s) Performed: AN AD HOC LABOR EPIDURAL     Patient location during evaluation: Mother Baby Anesthesia Type: Epidural Level of consciousness: awake, oriented and awake and alert Pain management: pain level controlled Vital Signs Assessment: post-procedure vital signs reviewed and stable Respiratory status: spontaneous breathing, respiratory function stable and nonlabored ventilation Cardiovascular status: stable Postop Assessment: no headache, adequate PO intake, able to ambulate, patient able to bend at knees, no backache and no apparent nausea or vomiting Anesthetic complications: no   No notable events documented.  Last Vitals:  Vitals:   12/30/20 0157 12/30/20 0607  BP: 110/72 (!) 109/58  Pulse: 89 87  Resp: 18 18  Temp: 37.4 C 36.9 C  SpO2: 100% 99%    Last Pain:  Vitals:   12/30/20 0810  TempSrc:   PainSc: 5    Pain Goal:                   Clifford Benninger

## 2020-12-30 NOTE — Lactation Note (Signed)
This note was copied from a baby's chart. Lactation Consultation Note  Patient Name: Girl Nuri Ticknor [id number] Date: 12/30/2020 Reason for consult: Initial assessment Age:35 P1, Mother reports that she plans to give infant breast and formula. She just gave infant a bottle and now is having breakfast.  Mother will page when she is ready to breastfeed with the next feeding.   Maternal Data    Feeding Mother's Current Feeding Choice: Breast Milk and Formula Nipple Type: Extra Slow Flow  LATCH Score                    Lactation Tools Discussed/Used    Interventions    Discharge    Consult Status Consult Status: Follow-up Date: 12/30/20 Follow-up type: In-patient    Jess Barters [facility] 12/30/2020, 9:49 AM

## 2020-12-30 NOTE — Lactation Note (Signed)
This note was copied from a baby's chart. Lactation Consultation Note  Patient Name: [name] [id number] Date: 12/30/2020 Reason for consult: Initial assessment Age:33 Hours  Mother paged for latch assistance.  Assist mother to chair for cradle hold. Mother had large areola edema. Reverse pressure preformed . Infant was able to get good depth. Mother was not able to tolerate position. She was uncomfortable with cross cradle hold and football hold. Infant place in cradle hold. Infant sustained latch for 15 mins and the latched to alternate breast. Observed a few swallows.  Advised mother to continue with reverse pressure and wear a bra to decrease fluid. Encouraged mother to continue to cue base feed. Discussed cluster feeding.  Mother to feed infant 8-12 or more times in 24 hours.  Mother to continue to do frequent STS.  Maternal Data Has patient been taught Hand Expression?: Yes Does the patient have breastfeeding experience prior to this delivery?: No  Feeding Mother's Current Feeding Choice: Breast Milk and Formula  LATCH Score Latch: Grasps breast easily, tongue down, lips flanged, rhythmical sucking.  Audible Swallowing: A few with stimulation  Type of Nipple: Everted at rest and after stimulation (some edema noted, reverse pressure preformed)  Comfort (Breast/Nipple): Soft / non-tender  Hold (Positioning): Assistance needed to correctly position infant at breast and maintain latch.  LATCH Score: 8   Lactation Tools Discussed/Used    Interventions Interventions: Breast feeding basics reviewed;Assisted with latch;Skin to skin;Hand express;Breast compression;Adjust position;Support pillows;Position options;Hand pump;Education  Discharge    Consult Status Consult Status: Follow-up Date: 12/30/20 Follow-up type: In-patient    Jess Barters [facility] 12/30/2020, 3:09 PM

## 2020-12-30 NOTE — Lactation Note (Addendum)
This note was copied from a baby's chart. Lactation Consultation Note  Patient Name: Charlene Gregory [id number] Date: 12/30/2020 Reason for consult: L&D Initial assessment;1st time breastfeeding;Early term 37-38.6wks Age:35 hours LC entered the room, mom was doing skin to skin with infant. Hosmer assist mom with breast stimulation prior to latching infant to help evert nipple shaft out more to help with latch. Mom latched infant on her right breast using the football hold, infant was on and off breast in the beginning but started sustaining latch towards the end of the feeding. Infant breastfeed for 12 minutes. Mom knows to ask RN/LC on MBU for further latch assistance if needed.  Mom knows to breastfeed infant according to primal cues: licking, kissing, hands and fist in mouth, rooting, and to breastfeed infant skin to skin. Mom could benefit from hand pump with size 24 mm breast flange to pre-pump breast to help evert nipple shaft out more prior  to latching infant at the breast. Maternal Data Has patient been taught Hand Expression?: Yes Does the patient have breastfeeding experience prior to this delivery?: No  Feeding Mother's Current Feeding Choice: Breast Milk and Formula  LATCH Score Latch: Repeated attempts needed to sustain latch, nipple held in mouth throughout feeding, stimulation needed to elicit sucking reflex.  Audible Swallowing: A few with stimulation  Type of Nipple: Flat  Comfort (Breast/Nipple): Soft / non-tender  Hold (Positioning): Assistance needed to correctly position infant at breast and maintain latch.  LATCH Score: 6   Lactation Tools Discussed/Used    Interventions Interventions: Assisted with latch;Adjust position;Breast compression;Breast feeding basics reviewed;Skin to skin;Support pillows;Position options;Expressed milk;Education  Discharge Pump: Personal [place] Program: Yes  Consult Status Consult Status: Follow-up Date: 12/30/20 Follow-up type:  In-patient    [name] 12/30/2020, 12:00 AM

## 2020-12-31 LAB — SURGICAL PATHOLOGY

## 2020-12-31 NOTE — Discharge Summary (Signed)
Postpartum Discharge Summary  Date of Service: 12/31/20     Patient Name: Charlene Gregory DOB: 07-24-1985 MRN: [id number]  Date of admission: 12/29/2020 Delivery date:12/29/2020  Delivering provider: Drema Dallas  Date of discharge: 12/31/2020  Admitting diagnosis: Chronic hypertension affecting pregnancy [O10.919] Intrauterine pregnancy: [redacted]w[redacted]d    Secondary diagnosis:  Active Problems:   Chronic hypertension affecting pregnancy   HSV-2 infection   Adnexal mass  Additional problems: Obesity (BMI  70)   Discharge diagnosis: Term Pregnancy Delivered                                              Post partum procedures: None Augmentation: AROM, Pitocin, Cytotec, and IP Foley Complications: None  Hospital course: Induction of Labor With Vaginal Delivery   35y.o. yo G1P1001 at 34w6das admitted to the hospital 12/29/2020 for induction of labor.  Indication for induction:  Chronic Hypertension .  Patient had an uncomplicated labor course as follows: Membrane Rupture Time/Date: 5:49 PM ,12/29/2020   Delivery Method:Vaginal, Spontaneous  Episiotomy: None  Lacerations:  2nd degree  Details of delivery can be found in separate delivery note.  Patient had a routine postpartum course. Patient is discharged home 12/31/20.  Newborn Data: Birth date:12/29/2020  Birth time:10:51 PM  Gender:Female  Living status:Living  Apgars:8 ,9  Weight:4006 g   Magnesium Sulfate received: No BMZ received: No Rhophylac:N/A MMR:N/A T-DaP:Given prenatally Flu: N/A Transfusion:No  Physical exam  Vitals:   12/30/20 1015 12/30/20 1400 12/30/20 2020 12/31/20 0617  BP: 128/75 100/77 113/67 119/65  Pulse: 85 89 80 84  Resp: _0 Temp: 98.8 F (37.1 C) 99 F (37.2 C) 99 F (37.2 C) 98.4 F (36.9 C)  TempSrc: Oral Oral Oral Oral  SpO2:   100% 100%  Weight:      Height:       General: alert, cooperative, and no distress Cardio: RRR Lungs: CTAB, no wheezes/rales/rhonchi Lochia:  appropriate Uterine Fundus: firm Incision: N/A DVT Evaluation: No evidence of DVT seen on physical exam. No cords or calf tenderness. Labs: Lab Results  Component Value Date   WBC 15.2 (H) 12/30/2020   HGB 9.2 (L) 12/30/2020   HCT 28.1 (L) 12/30/2020   MCV 68.2 (L) 12/30/2020   PLT 177 12/30/2020   CMP Latest Ref Rng & Units 12/29/2020  Glucose 70 - 99 mg/dL 77  BUN 6 - 20 mg/dL 9  Creatinine 0.44 - 1.00 mg/dL 0.52  Sodium 135 - 145 mmol/L 132(L)  Potassium 3.5 - 5.1 mmol/L 3.6  Chloride 98 - 111 mmol/L 104  CO2 22 - 32 mmol/L 19(L)  Calcium 8.9 - 10.3 mg/dL 9.1  Total Protein 6.5 - 8.1 g/dL 7.0  Total Bilirubin 0.3 - 1.2 mg/dL 0.3  Alkaline Phos 38 - 126 U/L 112  AST 15 - 41 U/L 17  ALT 0 - 44 U/L 19   Edinburgh Score: Edinburgh Postnatal Depression Scale Screening Tool 12/30/2020  I have been able to laugh and see the funny side of things. 0  I have looked forward with enjoyment to things. 0  I have blamed myself unnecessarily when things went wrong. 0  I have been anxious or worried for no good reason. 0  I have felt scared or panicky for no good reason. 0  Things have been getting on top of me. 0  I have  been so unhappy that I have had difficulty sleeping. 0  I have felt sad or miserable. 0  I have been so unhappy that I have been crying. 0  The thought of harming myself has occurred to me. 0  Edinburgh Postnatal Depression Scale Total 0      After visit meds:  Allergies as of 12/31/2020   No Known Allergies      Medication List     STOP taking these medications    aspirin EC 81 MG tablet   butalbital-acetaminophen-caffeine 50-325-40 MG tablet Commonly known as: FIORICET   docusate sodium 100 MG capsule Commonly known as: COLACE   famotidine 20 MG tablet Commonly known as: PEPCID   ferrous sulfate 325 (65 FE) MG tablet   pantoprazole 20 MG tablet Commonly known as: PROTONIX   valACYclovir 500 MG tablet Commonly known as: VALTREX       TAKE  these medications    albuterol 108 (90 Base) MCG/ACT inhaler Commonly known as: VENTOLIN HFA Inhale 2 puffs into the lungs every 4 (four) hours as needed for wheezing or shortness of breath.   amLODipine 5 MG tablet Commonly known as: NORVASC Take 5 mg by mouth daily.   azelastine 0.1 % nasal spray Commonly known as: ASTELIN Place 2 sprays into both nostrils 2 (two) times daily.   fluticasone 50 MCG/ACT nasal spray Commonly known as: FLONASE Place into both nostrils 2 (two) times daily.   ibuprofen 600 MG tablet Commonly known as: ADVIL Take 1 tablet (600 mg total) by mouth every 6 (six) hours as needed for mild pain, moderate pain or cramping.   levocetirizine 5 MG tablet Commonly known as: XYZAL Take 5 mg by mouth every evening.   montelukast 10 MG tablet Commonly known as: SINGULAIR Take 10 mg by mouth at bedtime.   PRENATAL VITAMIN PO Take by mouth.         Discharge home in stable condition Infant Feeding: Bottle and Breast Infant Disposition:home with mother Discharge instruction: per After Visit Summary and Postpartum booklet. Activity: Advance as tolerated. Pelvic rest for 6 weeks.  Diet: routine diet Anticipated Birth Control:  Interval bilateral salpingectomy Postpartum Appointment:6 weeks Additional Postpartum F/U: BP check 1 week Future Appointments:No future appointments. Follow up Visit:  Follow-up Information     Davies, Melissa, DO Follow up in 1 week(s).   Specialty: Obstetrics and Gynecology Why: Please keep your blood pressure check visit and postpartum visit as previously scheduled. Contact information: [address] [place] [address] [phone]                     12/31/2020 Melissa Davies, DO   

## 2021-01-08 ENCOUNTER — Other Ambulatory Visit: Payer: Self-pay

## 2021-01-08 ENCOUNTER — Encounter (HOSPITAL_COMMUNITY): Payer: Self-pay | Admitting: Obstetrics and Gynecology

## 2021-01-08 ENCOUNTER — Inpatient Hospital Stay (HOSPITAL_COMMUNITY)
Admission: AD | Admit: 2021-01-08 | Discharge: 2021-01-08 | Disposition: A | Discharge status: Home / Self Care | Payer: Managed Care, Other (non HMO) | Attending: Obstetrics and Gynecology | Admitting: Obstetrics and Gynecology

## 2021-01-08 DIAGNOSIS — O1003 Pre-existing essential hypertension complicating the puerperium: Secondary | ICD-10-CM | POA: Insufficient documentation

## 2021-01-08 DIAGNOSIS — G44209 Tension-type headache, unspecified, not intractable: Secondary | ICD-10-CM | POA: Diagnosis not present

## 2021-01-08 DIAGNOSIS — O1093 Unspecified pre-existing hypertension complicating the puerperium: Secondary | ICD-10-CM

## 2021-01-08 DIAGNOSIS — O10919 Unspecified pre-existing hypertension complicating pregnancy, unspecified trimester: Secondary | ICD-10-CM

## 2021-01-08 DIAGNOSIS — Z79899 Other long term (current) drug therapy: Secondary | ICD-10-CM | POA: Diagnosis not present

## 2021-01-08 DIAGNOSIS — Z8616 Personal history of COVID-19: Secondary | ICD-10-CM | POA: Diagnosis not present

## 2021-01-08 DIAGNOSIS — O99355 Diseases of the nervous system complicating the puerperium: Secondary | ICD-10-CM

## 2021-01-08 DIAGNOSIS — O99893 Other specified diseases and conditions complicating puerperium: Secondary | ICD-10-CM | POA: Insufficient documentation

## 2021-01-08 LAB — CBC WITH DIFFERENTIAL/PLATELET
Abs Immature Granulocytes: 0.12 10*3/uL — ABNORMAL HIGH (ref 0.00–0.07)
Basophils Absolute: 0.1 10*3/uL (ref 0.0–0.1)
Basophils Relative: 1 %
Eosinophils Absolute: 0.3 10*3/uL (ref 0.0–0.5)
Eosinophils Relative: 3 %
HCT: 29.7 % — ABNORMAL LOW (ref 36.0–46.0)
Hemoglobin: 9.4 g/dL — ABNORMAL LOW (ref 12.0–15.0)
Immature Granulocytes: 1 %
Lymphocytes Relative: 25 %
Lymphs Abs: 2.3 10*3/uL (ref 0.7–4.0)
MCH: 21.8 pg — ABNORMAL LOW (ref 26.0–34.0)
MCHC: 31.6 g/dL (ref 30.0–36.0)
MCV: 68.9 fL — ABNORMAL LOW (ref 80.0–100.0)
Monocytes Absolute: 1 10*3/uL (ref 0.1–1.0)
Monocytes Relative: 10 %
Neutro Abs: 5.6 10*3/uL (ref 1.7–7.7)
Neutrophils Relative %: 60 %
Platelets: 279 10*3/uL (ref 150–400)
RBC: 4.31 MIL/uL (ref 3.87–5.11)
RDW: 17 % — ABNORMAL HIGH (ref 11.5–15.5)
WBC: 9.4 10*3/uL (ref 4.0–10.5)
nRBC: 0 % (ref 0.0–0.2)

## 2021-01-08 LAB — COMPREHENSIVE METABOLIC PANEL
ALT: 20 U/L (ref 0–44)
AST: 18 U/L (ref 15–41)
Albumin: 3.1 g/dL — ABNORMAL LOW (ref 3.5–5.0)
Alkaline Phosphatase: 82 U/L (ref 38–126)
Anion gap: 7 (ref 5–15)
BUN: 7 mg/dL (ref 6–20)
CO2: 24 mmol/L (ref 22–32)
Calcium: 9.2 mg/dL (ref 8.9–10.3)
Chloride: 108 mmol/L (ref 98–111)
Creatinine, Ser: 0.8 mg/dL (ref 0.44–1.00)
GFR, Estimated: >60 mL/min (ref >60)
Glucose, Bld: 86 mg/dL (ref 70–99)
Potassium: 3.6 mmol/L (ref 3.5–5.1)
Sodium: 139 mmol/L (ref 135–145)
Total Bilirubin: 0.5 mg/dL (ref 0.3–1.2)
Total Protein: 6.8 g/dL (ref 6.5–8.1)

## 2021-01-08 MED ORDER — IBUPROFEN 600 MG PO TABS
600.0000 mg | ORAL_TABLET | Freq: Once | ORAL | Status: AC
  Administered 2021-01-08: 600 mg via ORAL
  Filled 2021-01-08: qty 1

## 2021-01-08 MED ORDER — ACETAMINOPHEN 500 MG PO TABS
1000.0000 mg | ORAL_TABLET | Freq: Once | ORAL | Status: AC
  Administered 2021-01-08: 1000 mg via ORAL
  Filled 2021-01-08: qty 2

## 2021-01-08 MED ORDER — AMLODIPINE BESYLATE 5 MG PO TABS: 10.0000 mg | ORAL_TABLET | Freq: Every day | ORAL | 1 refills | Status: DC

## 2021-01-08 NOTE — MAU Provider Note (Signed)
History     CSN: [id number]  Arrival date and time: 01/08/21 1301   Event Date/Time   First Provider Initiated Contact with Patient 01/08/21 1402      Chief Complaint  Patient presents with   Headache   HPI  [name] is a 35 y.o. female G1P1001 Status post vaginal delivery on 12/29/2020.  She prsents with complaints of HA. No history of HA's or migraine HA's.  She has a history of CHTN on amlodipine 5 mg daily. She has been on amlodipine for years. Her last dose was this morning. She reports a frontal HA x 24 hours. The headache is constant. She took tylenol 1 gram this morning, which did not help. She feels exhausted at times and does not feel she is eating and drinking like she should. She is breastfeeding/ pumping. She does have support, however has not asked anyone outside of her partner to help. Denies abdominal pain or photophobia.   OB History     Gravida  1   Para  1   Term  1   Preterm      AB      Living  1      SAB      IAB      Ectopic      Multiple  0   Live Births  1           Past Medical History:  Diagnosis Date   Acid reflux disease    Adnexal mass    Anemia    Environmental allergies    Headache    History of 2019 novel coronavirus disease (COVID-19) 05/13/2019   HSV-2 infection    Hypertension    takes amlodipine '5mg'$    Morbid obesity due to excess calories (HCC)     Past Surgical History:  Procedure Laterality Date   COLONOSCOPY W/ POLYPECTOMY     UPPER GI ENDOSCOPY      Family History  Problem Relation Age of Onset   Healthy Mother    Cancer Father     Social History   Tobacco Use   Smoking status: Never   Smokeless tobacco: Never  Vaping Use   Vaping Use: Never used  Substance Use Topics   Alcohol use: Not Currently    Comment: not while peg   Drug use: Never    Allergies: No Known Allergies  Medications Prior to Admission  Medication Sig Dispense Refill Last Dose   amLODipine (NORVASC) 5 MG tablet  Take 5 mg by mouth daily.   01/08/2021   fluticasone (FLONASE) 50 MCG/ACT nasal spray Place into both nostrils 2 (two) times daily.   01/08/2021   ibuprofen (ADVIL) 600 MG tablet Take 1 tablet (600 mg total) by mouth every 6 (six) hours as needed for mild pain, moderate pain or cramping. 30 tablet 3 Past Week   levocetirizine (XYZAL) 5 MG tablet Take 5 mg by mouth every evening.   01/08/2021   montelukast (SINGULAIR) 10 MG tablet Take 10 mg by mouth at bedtime.   01/08/2021   Prenatal Vit-Fe Fumarate-FA (PRENATAL VITAMIN PO) Take by mouth.   01/08/2021   albuterol (VENTOLIN HFA) 108 (90 Base) MCG/ACT inhaler Inhale 2 puffs into the lungs every 4 (four) hours as needed for wheezing or shortness of breath. (Patient not taking: No sig reported) 18 g 0    azelastine (ASTELIN) 0.1 % nasal spray Place 2 sprays into both nostrils 2 (two) times daily. 30 mL 0  Results for orders placed or performed during the hospital encounter of 01/08/21 (from the past 48 hour(s))  CBC with Differential/Platelet     Status: Abnormal   Collection Time: 01/08/21  1:23 PM  Result Value Ref Range   WBC 9.4 4.0 - 10.5 K/uL   RBC 4.31 3.87 - 5.11 MIL/uL   Hemoglobin 9.4 (L) 12.0 - 15.0 g/dL   HCT 29.7 (L) 36.0 - 46.0 %   MCV 68.9 (L) 80.0 - 100.0 fL   MCH 21.8 (L) 26.0 - 34.0 pg   MCHC 31.6 30.0 - 36.0 g/dL   RDW 17.0 (H) 11.5 - 15.5 %   Platelets 279 150 - 400 K/uL    Comment: REPEATED TO VERIFY   nRBC 0.0 0.0 - 0.2 %   Neutrophils Relative % 60 %   Neutro Abs 5.6 1.7 - 7.7 K/uL   Lymphocytes Relative 25 %   Lymphs Abs 2.3 0.7 - 4.0 K/uL   Monocytes Relative 10 %   Monocytes Absolute 1.0 0.1 - 1.0 K/uL   Eosinophils Relative 3 %   Eosinophils Absolute 0.3 0.0 - 0.5 K/uL   Basophils Relative 1 %   Basophils Absolute 0.1 0.0 - 0.1 K/uL   Immature Granulocytes 1 %   Abs Immature Granulocytes 0.12 (H) 0.00 - 0.07 K/uL    Comment: Performed at [facility], [address], [place], [place] [address]   Comprehensive metabolic panel     Status: Abnormal   Collection Time: 01/08/21  1:23 PM  Result Value Ref Range   Sodium 139 135 - 145 mmol/L   Potassium 3.6 3.5 - 5.1 mmol/L   Chloride 108 98 - 111 mmol/L   CO2 24 22 - 32 mmol/L   Glucose, Bld 86 70 - 99 mg/dL    Comment: Glucose reference range applies only to samples taken after fasting for at least 8 hours.   BUN 7 6 - 20 mg/dL   Creatinine, Ser 0.80 0.44 - 1.00 mg/dL   Calcium 9.2 8.9 - 10.3 mg/dL   Total Protein 6.8 6.5 - 8.1 g/dL   Albumin 3.1 (L) 3.5 - 5.0 g/dL   AST 18 15 - 41 U/L   ALT 20 0 - 44 U/L   Alkaline Phosphatase 82 38 - 126 U/L   Total Bilirubin 0.5 0.3 - 1.2 mg/dL   GFR, Estimated >60 >60 mL/min    Comment: (NOTE) Calculated using the CKD-EPI Creatinine Equation (2021)    Anion gap 7 5 - 15    Comment: Performed at [facility] [address] [place], [place] [address]    Review of Systems  Eyes:  Negative for photophobia and visual disturbance.  Gastrointestinal:  Negative for abdominal pain.  Neurological:  Negative for headaches.  Physical Exam   Blood pressure (!) 147/74, pulse 74, temperature 98.6 F (37 C), resp. rate 18, unknown if currently breastfeeding.  Patient Vitals for the past 24 hrs:  BP Temp Pulse Resp  01/08/21 1541 -- -- -- 18  01/08/21 1515 (!) 143/81 -- 81 --  01/08/21 1500 (!) 145/79 -- 78 --  01/08/21 1445 (!) 147/77 -- 73 --  01/08/21 1401 (!) 147/74 -- 74 --  01/08/21 1346 (!) 141/69 -- 73 --  01/08/21 1337 (!) 150/72 -- 82 --  01/08/21 1320 (!) 144/80 98.6 F (37 C) 80 18    Physical Exam Constitutional:      General: She is not in acute distress.    Appearance:  She is well-developed. She is not ill-appearing, toxic-appearing or diaphoretic.  HENT:     Head: Normocephalic.  Eyes:     Pupils: Pupils are equal, round, and reactive to light.  Neurological:     Mental Status: She is alert and oriented to person, place, and time.     GCS: GCS eye subscore is  4. GCS verbal subscore is 5. GCS motor subscore is 6.     Deep Tendon Reflexes: Reflexes normal (Negative clonus).  Psychiatric:        Behavior: Behavior normal.   MAU Course  Procedures None  MDM  PIH labs collected Tylenol 1 gram & ibuprofen 600 mg PO given.  HA down to 0/10.  Meal given to patient while waiting.   Assessment and Plan   A:  1. Postpartum state   2. Chronic hypertension affecting pregnancy   3. Acute non intractable tension-type headache      P:  Discharge home in stable condition Return to MAU if symptoms worsen Discussed patient with Dr. Delora Fuel for f/u BP on Monday in the office.  Increase amlodipine to 10 mg daily Ok to use tylenol and ibuprofen as directed on the bottle.  Lezlie Lye, NP 01/08/2021 5:16 PM

## 2021-01-08 NOTE — MAU Note (Signed)
PP 12/29/20 vag delivery. Pt stated she has had a headache on and off for 2 days. Took tylenol without much relief. Denies any visual changes or abd pain. Some swelling in her feet but she states that is getting better since delivery.  Chronic hypertension on amlodipine '5mg'$  daily.  B/P has bee a little elevated at home 135-104/90's.

## 2021-01-12 ENCOUNTER — Telehealth (HOSPITAL_COMMUNITY): Payer: Self-pay

## 2021-01-12 NOTE — Telephone Encounter (Signed)
"  I'm doing good. It's been an adjustment, My bleeding has stopped pretty much. I'm not having any pain." Patient declines any questions or concerns about her healing.  "She's doing good. We had our first doctor's appointment and everything was fine. She sleeps in a bassinet."RN reviewed ABC's of safe sleep with patient. Patient declines any questions or concerns about baby.  EPDS score is 4.  Sharyn Lull [facility] 01/12/2021,1405

## 2021-01-24 IMAGING — US US OB < 14 WEEKS - US OB TV
1 series · 13 of 28 positions shown · non-contrast
Comparison: Pelvic ultrasound dated 02/27/2020.

CLINICAL DATA: 34-year-old female with positive beta hCG and pelvic
pain. LMP: 04/01/2020 corresponding to an estimated gestational age
of 4 weeks, 1 day.

EXAM:
OBSTETRIC <14 WK US AND TRANSVAGINAL OB US
TECHNIQUE: Both transabdominal and transvaginal ultrasound examinations were
performed for complete evaluation of the gestation as well as the
maternal uterus, adnexal regions, and pelvic cul-de-sac.
Transvaginal technique was performed to assess early pregnancy.

[Series 1: us ob less than 14 weeks with ob transvaginal · 102 acquisitions, 13 frames shown]
[im 4/102]
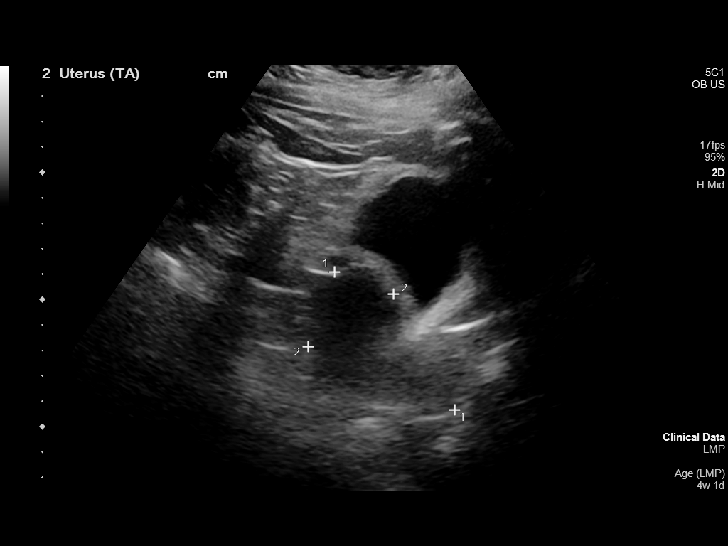
[im 12/102]
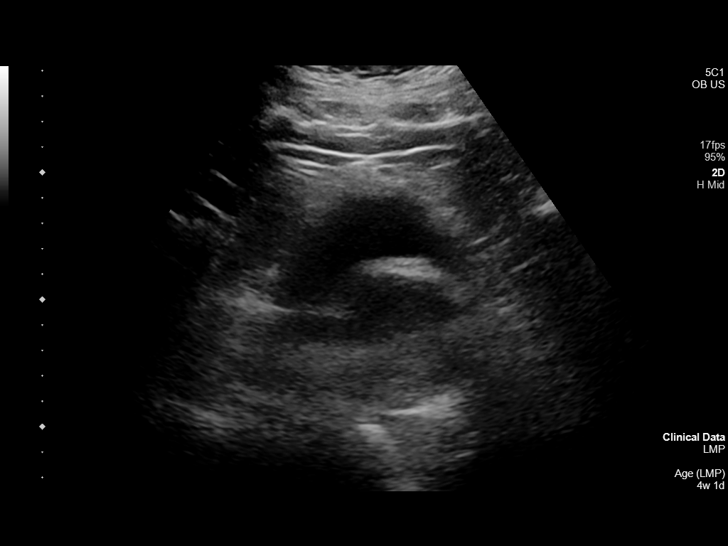
[im 19/102]
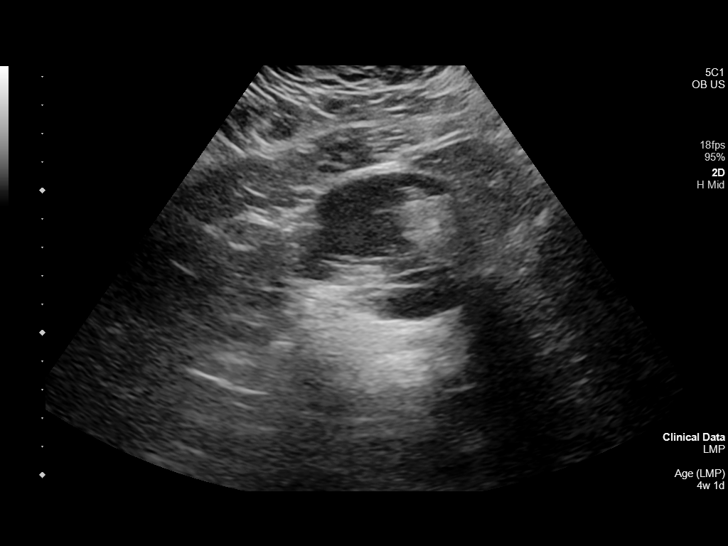
[im 27/102]
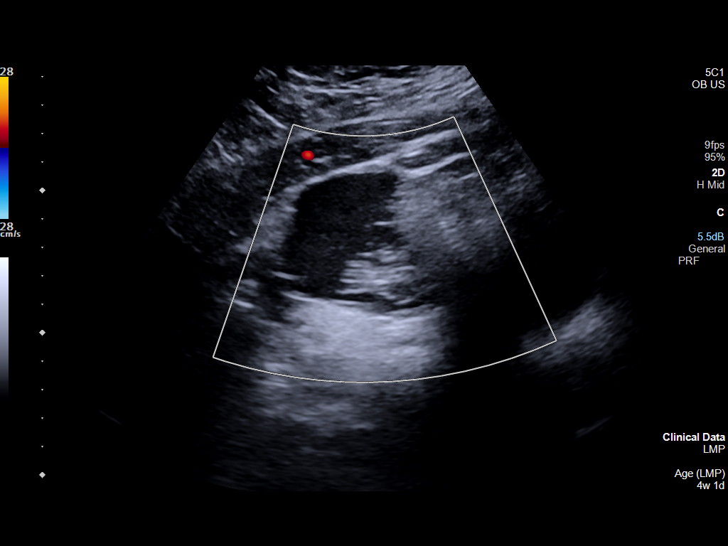
[im 34/102]
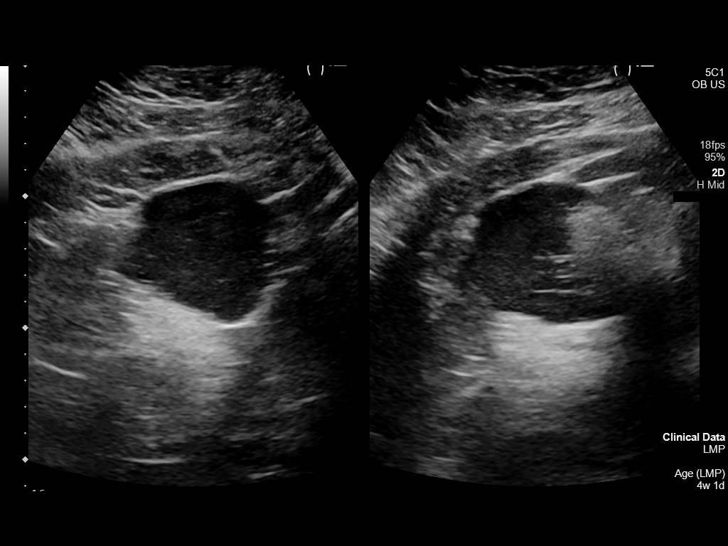
[im 42/102]
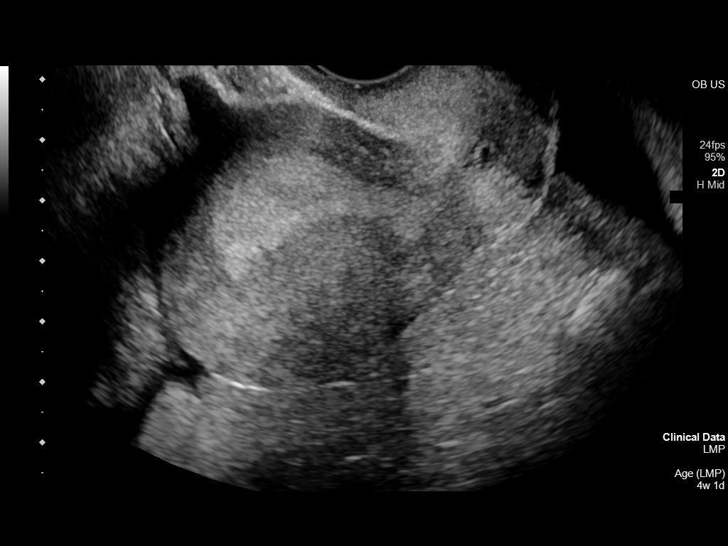
[im 53/102]
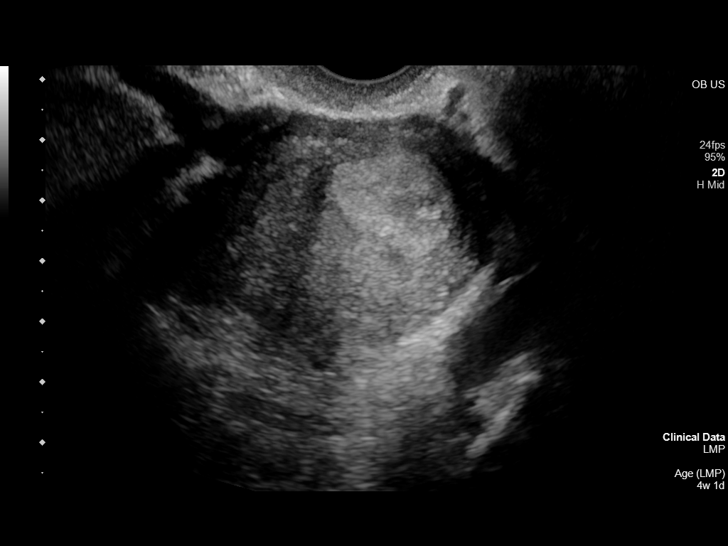
[im 60/102]
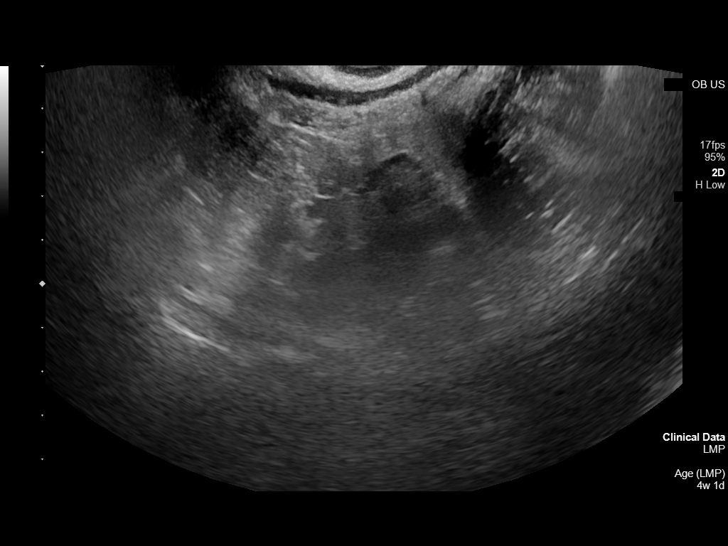
[im 68/102]
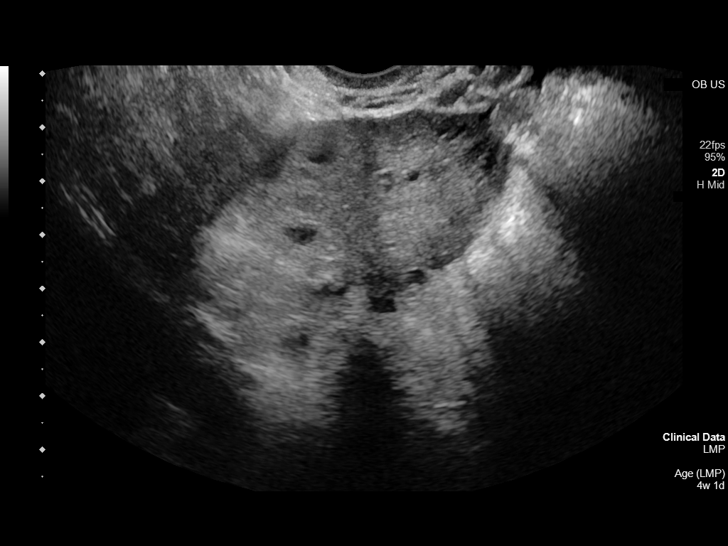
[im 75/102]
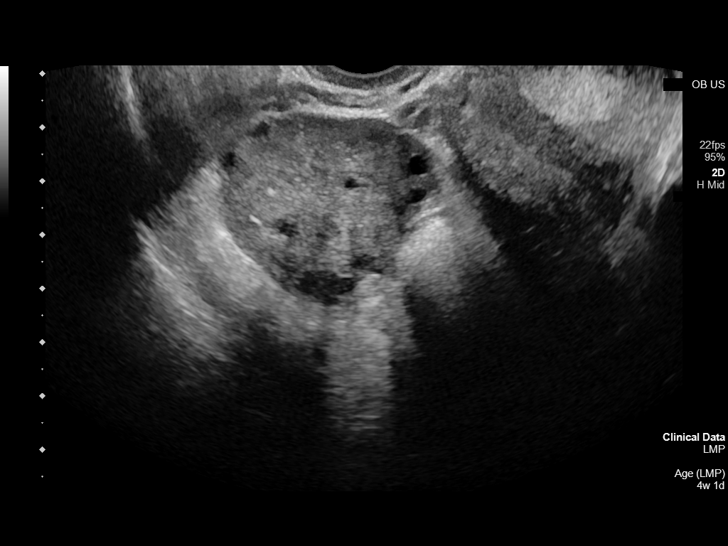
[im 83/102]
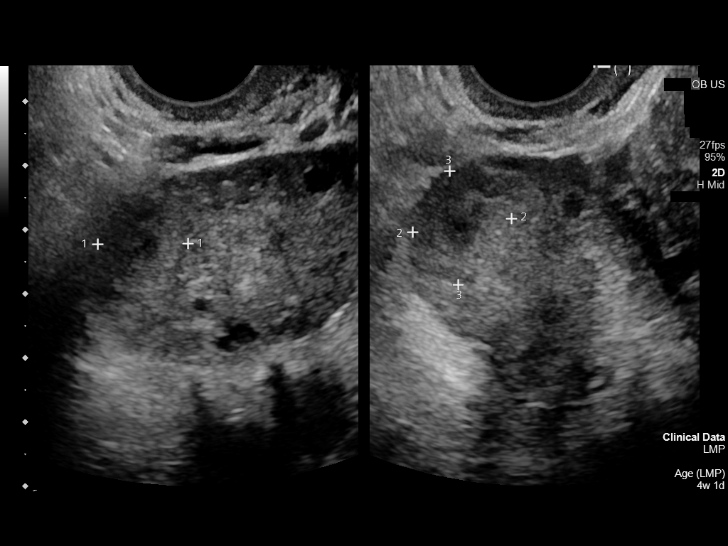
[im 90/102]
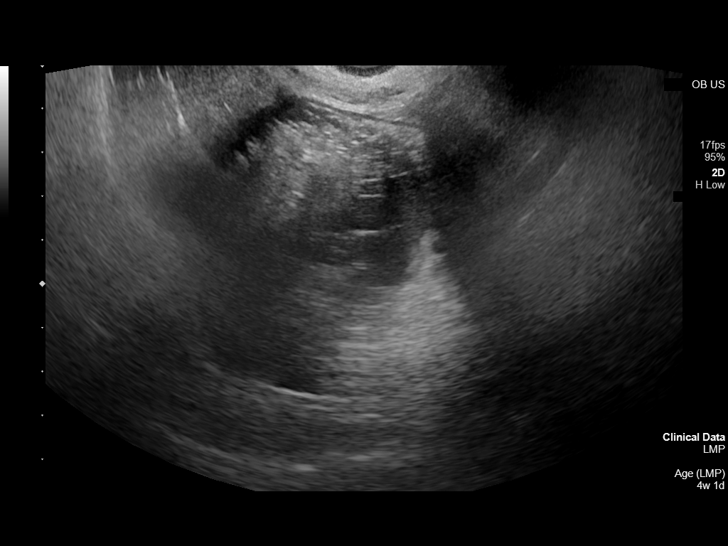
[im 98/102]
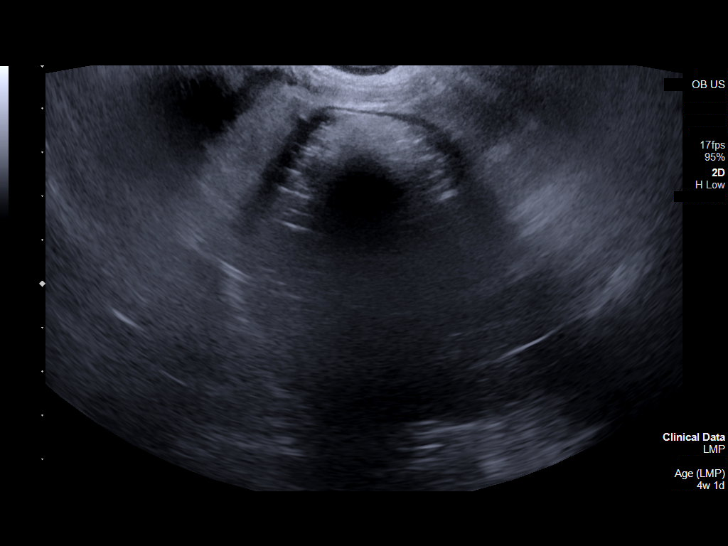

[13 of 28 positions shown; findings below may reference images not displayed]

FINDINGS: The uterus is anteverted and appears unremarkable.

The endometrium measures 17 mm in thickness. No intrauterine
pregnancy identified.

The right ovary is unremarkable and measures 5.2 x 3.8 x 4.3 cm for
a volume of 44 cc.

The left ovary measures 7.4 x 6.6 x 6.1 cm for a volume of 156 cc.
There is a 5.7 x 6.6 x 6.8 cm complex lesion in the region of the
left adnexal with posterior shadowing with sonographic features most
consistent with a dermoid.

Small to moderate free fluid in the pelvis.
IMPRESSION: 1. No intrauterine pregnancy identified and no suspicious adnexal
masses noted. Findings consistent with pregnancy of unknown location
and differential diagnosis include: An early IUP, recent spontaneous
miscarriage, or an occult ectopic pregnancy. Clinical correlation
and follow-up with serial HCG levels and repeat ultrasound, as
clinically indicated, recommended.
2. Left adnexal dermoid.

## 2021-05-30 ENCOUNTER — Emergency Department: Payer: Managed Care, Other (non HMO)

## 2021-05-30 ENCOUNTER — Other Ambulatory Visit: Payer: Self-pay

## 2021-05-30 ENCOUNTER — Emergency Department
Admission: EM | Admit: 2021-05-30 | Discharge: 2021-05-30 | Disposition: A | Discharge status: Home / Self Care | Payer: Managed Care, Other (non HMO) | Attending: Emergency Medicine | Admitting: Emergency Medicine

## 2021-05-30 DIAGNOSIS — R1013 Epigastric pain: Secondary | ICD-10-CM | POA: Diagnosis present

## 2021-05-30 DIAGNOSIS — M79602 Pain in left arm: Secondary | ICD-10-CM | POA: Insufficient documentation

## 2021-05-30 DIAGNOSIS — K2100 Gastro-esophageal reflux disease with esophagitis, without bleeding: Secondary | ICD-10-CM | POA: Insufficient documentation

## 2021-05-30 LAB — CBC
HCT: 35.9 % — ABNORMAL LOW (ref 36.0–46.0)
Hemoglobin: 11.1 g/dL — ABNORMAL LOW (ref 12.0–15.0)
MCH: 19.9 pg — ABNORMAL LOW (ref 26.0–34.0)
MCHC: 30.9 g/dL (ref 30.0–36.0)
MCV: 64.5 fL — ABNORMAL LOW (ref 80.0–100.0)
Platelets: 220 10*3/uL (ref 150–400)
RBC: 5.57 MIL/uL — ABNORMAL HIGH (ref 3.87–5.11)
RDW: 18 % — ABNORMAL HIGH (ref 11.5–15.5)
WBC: 8 10*3/uL (ref 4.0–10.5)
nRBC: 0 % (ref 0.0–0.2)

## 2021-05-30 LAB — BASIC METABOLIC PANEL
Anion gap: 7 (ref 5–15)
BUN: 9 mg/dL (ref 6–20)
CO2: 25 mmol/L (ref 22–32)
Calcium: 9.1 mg/dL (ref 8.9–10.3)
Chloride: 105 mmol/L (ref 98–111)
Creatinine, Ser: 0.65 mg/dL (ref 0.44–1.00)
GFR, Estimated: >60 mL/min (ref >60)
Glucose, Bld: 89 mg/dL (ref 70–99)
Potassium: 3.5 mmol/L (ref 3.5–5.1)
Sodium: 137 mmol/L (ref 135–145)

## 2021-05-30 LAB — TROPONIN I (HIGH SENSITIVITY): Troponin I (High Sensitivity): <2 ng/L (ref <18)

## 2021-05-30 MED ORDER — ACETAMINOPHEN 325 MG PO TABS
650.0000 mg | ORAL_TABLET | Freq: Once | ORAL | Status: AC
  Administered 2021-05-30: 650 mg via ORAL
  Filled 2021-05-30: qty 2

## 2021-05-30 MED ORDER — SUCRALFATE 1 G PO TABS: 1.0000 g | ORAL_TABLET | Freq: Four times a day (QID) | ORAL | 0 refills | Status: DC

## 2021-05-30 MED ORDER — METHOCARBAMOL 500 MG PO TABS: 500.0000 mg | ORAL_TABLET | Freq: Two times a day (BID) | ORAL | 0 refills | Status: DC | PRN

## 2021-05-30 NOTE — ED Notes (Signed)
See triage note  presents with epigastric pain  hx of Acid reflux  states she was taking Protonix bid but states it didn't work  also had some burning down left arm  was sent in by UC  was given GI cocktail PTA  states feels better

## 2021-05-30 NOTE — ED Triage Notes (Signed)
Pt states that she has had problems with her acid reflux for the last couple weeks and then started having pain in her L arm today- pt went to UC and did her EKG and it was normal

## 2021-05-30 NOTE — ED Provider Notes (Signed)
[facility] Provider Note    Event Date/Time   First MD Initiated Contact with Patient 05/30/21 1245     (approximate)   History   Gastroesophageal Reflux   HPI  Charlene Gregory is a 36 y.o. female presents to the ER today with complaint of epigastric pain and left arm pain.  She reports the symptoms started a couple of weeks ago but seem to worsen yesterday.  She has a history of GERD and has been diagnosed with gastritis in the past.  She reports she saw her PCP 2 weeks ago and had an H. pylori breath test which was negative.  She was subsequently started on Pantoprazole 40 mg twice daily which does not seem to be very effective.  She has had longstanding issues with GERD since her previous pregnancies.  She had an upper GI 08/2019 which was normal, no evidence of H. pylori.  She describes the pain in her left arm as burning.  She denies chest pain, chest tightness, shortness of breath, numbness, tingling or weakness of her left upper extremity.  She denies nausea, vomiting, diarrhea, constipation or blood in her stool.  She denies any injury to the area but does admit that she sleeps on her left side facing her baby all night long.  She does intermittently take Ibuprofen for headaches, typically 1 time a week.  She has not tried anything additional OTC for symptoms.  She did go to urgent care today, ECG was normal but they advised her to present to the ER for further evaluation of her symptoms.      Physical Exam   Triage Vital Signs: ED Triage Vitals  Enc Vitals Group     BP 05/30/21 1238 (!) 147/100     Pulse Rate 05/30/21 1238 91     Resp 05/30/21 1238 18     Temp 05/30/21 1238 99.6 F (37.6 C)     Temp Source 05/30/21 1238 Oral     SpO2 05/30/21 1238 96 %     Weight 05/30/21 1239 (!) 370 lb (167.8 kg)     Height 05/30/21 1239 5\' 5"  (1.651 m)     Head Circumference --      Peak Flow --      Pain Score 05/30/21 1238 2     Pain Loc --      Pain Edu? --       Excl. in [place]? --     Most recent vital signs: Vitals:   05/30/21 1238  BP: (!) 147/100  Pulse: 91  Resp: 18  Temp: 99.6 F (37.6 C)  SpO2: 96%     General: Awake, no distress.  CV:  RRR, no murmur.  Radial pulses 2+ bilaterally. Resp:  CTA bilaterally. Abd:  Abdomen soft and nontender. MSK:  No pain with palpation in the substernal region.  Normal internal and external rotation of the left shoulder.  Strength 5/5 BUE.  Handgrips equal.   ED Results / Procedures / Treatments   Labs (all labs ordered are listed, but only abnormal results are displayed) Labs Reviewed  CBC - Abnormal; Notable for the following components:      Result Value   RBC 5.57 (*)    Hemoglobin 11.1 (*)    HCT 35.9 (*)    MCV 64.5 (*)    MCH 19.9 (*)    RDW 18.0 (*)    All other components within normal limits  BASIC METABOLIC PANEL  POC URINE PREG, ED  TROPONIN I (HIGH SENSITIVITY)  TROPONIN I (HIGH SENSITIVITY)     EKG  ED ECG REPORT   Date: 05/30/2021  EKG Time: 2:36 PM  Rate: 85  Rhythm: normal sinus rhythm  Axis: normal  Intervals:none  ST&T Change: none  Narrative Interpretation: Normal rate and rhythm, no acute findings    RADIOLOGY Imaging Orders         DG Chest 2 View    IMPRESSION: Normal chest x-ray.    MEDICATIONS ORDERED IN ED: Meds ordered this encounter  Medications   acetaminophen (TYLENOL) tablet 650 mg   methocarbamol (ROBAXIN) 500 MG tablet    Sig: Take 1 tablet (500 mg total) by mouth every 12 (twelve) hours as needed for muscle spasms.    Dispense:  10 tablet    Refill:  0    Order Specific Question:   Supervising Provider    Answer:   Lavonia Drafts [[id number]]   sucralfate (CARAFATE) 1 g tablet    Sig: Take 1 tablet (1 g total) by mouth 4 (four) times daily.    Dispense:  40 tablet    Refill:  0    Order Specific Question:   Supervising Provider    Answer:   Lavonia Drafts [[id number]]      IMPRESSION / MDM / [place] / ED COURSE  I  reviewed the triage vital signs and the nursing notes.  Epigastric Pain, Left Arm Pain:  Differential diagnosis includes, but is not limited to GERD, gastritis, less likely ACS, musculoskeletal pain of the left arm  CBC shows mild anemia which is a chronic issue, no signs of acute blood loss at this time BMET does not show any concern for dehydration or metabolic disease Negative troponin indicating that ACS is unlikely ECG normal indicating that ACS is unlikely Tylenol 650 mg p.o. x1 Chest x-ray appears normal, confirmed by radiology Advised her to continue Pantoprazole 40 mg twice daily per primary care physician We will add Carafate 1 g 4 times daily Try to avoid foods that trigger reflux Encourage weight loss as this can reduce reflux symptoms Avoid Ibuprofen as this can cause reflux and gastric symptoms be worse Rx for Methocarbamol 500 mg every 12 hours as needed-sedation caution given Advised her to avoid sleeping on her left arm for the next few nights in case this is contributing to her left arm pain She will follow-up with her PCP if needed and request referral to GI if symptoms persist or worsen  FINAL CLINICAL IMPRESSION(S) / ED DIAGNOSES   Final diagnoses:  Gastroesophageal reflux disease with esophagitis without hemorrhage  Left arm pain     Rx / DC Orders   ED Discharge Orders          Ordered    methocarbamol (ROBAXIN) 500 MG tablet  Every 12 hours PRN        05/30/21 1433    sucralfate (CARAFATE) 1 g tablet  4 times daily        05/30/21 1435             Note:  This document was prepared using Dragon voice recognition software and may include unintentional dictation errors.    Jearld Fenton, NP 05/30/21 1436    Lavonia Drafts, MD 05/30/21 [phone]

## 2021-05-30 NOTE — Discharge Instructions (Signed)
You were seen today for epigastric pain and left arm pain.  I think your epigastric pain is related to gastritis.  Continue pantoprazole as prescribed by your primary care physician.  I am adding Carafate 1 g 4 times daily in addition to your pantoprazole.  Try to avoid foods that trigger reflux.  Try to avoid laying on your left arm as this may be causing some of your left arm pain.  I have prescribed you a muscle relaxer 2 times daily as needed but be advised that this may cause sedation.  Avoid ibuprofen as this may cause your GI symptoms to be worse.  Please follow-up with your PCP as needed if symptoms persist or worsen.  You may need to get a GI referral from your PCP if your GI symptoms persist.

## 2021-06-11 ENCOUNTER — Inpatient Hospital Stay: Payer: Managed Care, Other (non HMO) | Admitting: Oncology

## 2021-06-11 ENCOUNTER — Inpatient Hospital Stay: Payer: Managed Care, Other (non HMO)

## 2021-06-24 ENCOUNTER — Telehealth: Payer: Self-pay

## 2021-06-24 NOTE — Telephone Encounter (Signed)
Spoke with Patient in regards to her NP appt. Patient confirmed appt. She verified medications she is currently taking. I informed her that I would update these in her chart. Patient verified health history is UTD. Patient asked for location. Informed patient where we are located. Informed patient that we look forward to seeing her tomorrow afternoon for her appointment.  ?

## 2021-06-25 ENCOUNTER — Other Ambulatory Visit: Payer: Self-pay

## 2021-06-25 ENCOUNTER — Encounter: Payer: Self-pay | Admitting: Oncology

## 2021-06-25 ENCOUNTER — Inpatient Hospital Stay: Payer: Managed Care, Other (non HMO) | Attending: Oncology | Admitting: Oncology

## 2021-06-25 ENCOUNTER — Inpatient Hospital Stay: Payer: Managed Care, Other (non HMO)

## 2021-06-25 VITALS — BP 128/77 | HR 82 | Temp 96.6°F | Resp 18 | Wt 370.2 lb

## 2021-06-25 DIAGNOSIS — D509 Iron deficiency anemia, unspecified: Secondary | ICD-10-CM | POA: Insufficient documentation

## 2021-06-25 DIAGNOSIS — Z8616 Personal history of COVID-19: Secondary | ICD-10-CM | POA: Diagnosis not present

## 2021-06-25 DIAGNOSIS — I10 Essential (primary) hypertension: Secondary | ICD-10-CM | POA: Diagnosis not present

## 2021-06-25 DIAGNOSIS — E669 Obesity, unspecified: Secondary | ICD-10-CM | POA: Diagnosis not present

## 2021-06-25 DIAGNOSIS — K219 Gastro-esophageal reflux disease without esophagitis: Secondary | ICD-10-CM | POA: Diagnosis not present

## 2021-06-25 LAB — CBC WITH DIFFERENTIAL/PLATELET
Abs Immature Granulocytes: 0.04 10*3/uL (ref 0.00–0.07)
Basophils Absolute: 0.1 10*3/uL (ref 0.0–0.1)
Basophils Relative: 1 %
Eosinophils Absolute: 0.3 10*3/uL (ref 0.0–0.5)
Eosinophils Relative: 3 %
HCT: 35.1 % — ABNORMAL LOW (ref 36.0–46.0)
Hemoglobin: 11.1 g/dL — ABNORMAL LOW (ref 12.0–15.0)
Immature Granulocytes: 0 %
Lymphocytes Relative: 30 %
Lymphs Abs: 3 10*3/uL (ref 0.7–4.0)
MCH: 20.2 pg — ABNORMAL LOW (ref 26.0–34.0)
MCHC: 31.6 g/dL (ref 30.0–36.0)
MCV: 63.9 fL — ABNORMAL LOW (ref 80.0–100.0)
Monocytes Absolute: 1.1 10*3/uL — ABNORMAL HIGH (ref 0.1–1.0)
Monocytes Relative: 11 %
Neutro Abs: 5.4 10*3/uL (ref 1.7–7.7)
Neutrophils Relative %: 55 %
Platelets: 229 10*3/uL (ref 150–400)
RBC: 5.49 MIL/uL — ABNORMAL HIGH (ref 3.87–5.11)
RDW: 18.7 % — ABNORMAL HIGH (ref 11.5–15.5)
WBC: 9.8 10*3/uL (ref 4.0–10.5)
nRBC: 0 % (ref 0.0–0.2)

## 2021-06-25 LAB — RETIC PANEL
Immature Retic Fract: 24.3 % — ABNORMAL HIGH (ref 2.3–15.9)
RBC.: 5.46 MIL/uL — ABNORMAL HIGH (ref 3.87–5.11)
Retic Count, Absolute: 86.8 10*3/uL (ref 19.0–186.0)
Retic Ct Pct: 1.6 % (ref 0.4–3.1)
Reticulocyte Hemoglobin: 22.6 pg — ABNORMAL LOW (ref >27.9)

## 2021-06-25 LAB — IRON AND TIBC
Iron: 52 ug/dL (ref 28–170)
Saturation Ratios: 16 % (ref 10.4–31.8)
TIBC: 328 ug/dL (ref 250–450)
UIBC: 276 ug/dL

## 2021-06-25 LAB — FERRITIN: Ferritin: 47 ng/mL (ref 11–307)

## 2021-06-25 LAB — TECHNOLOGIST SMEAR REVIEW: Plt Morphology: ADEQUATE

## 2021-06-25 NOTE — Progress Notes (Signed)
Hematology/Oncology Consult note [phone] [phone]         Patient Care Team: Pa, [place] as PCP - General (Family Medicine) Earlie Server, MD as Consulting Physician (Hematology)  REFERRING PROVIDER: Johna Roles, PA  CHIEF COMPLAINTS/REASON FOR VISIT:  Evaluation of microcytic anemia.  HISTORY OF PRESENTING ILLNESS:   Charlene Gregory is a  36 y.o.  female with PMH listed below was seen in consultation at the request of  Clelland, Olivia M, [place]  for evaluation of microcytic anemia.   05/30/2021 cbc showed hemoglobin of 11.1, mcv 64,  Microcytosis is chronic. Patient has a history of iron deficiency anemia and was previously seen by [facility] hematology. she has previously used ferrous sulfate over the years at a dose of 325 mg daily but it causes such severe constipation She received IV iron with INFED at [facility] and tolerated well.  Menstrual period is regular and usually heavy for the first 2 days and usually last 4 days. She has vaginal delivery on 12/29/2020 Denies black tarry stool or blood in stool.  + fatigue  Review of Systems  Constitutional:  Negative for appetite change, chills, fatigue and fever.  HENT:   Negative for hearing loss and voice change.   Eyes:  Negative for eye problems.  Respiratory:  Negative for chest tightness and cough.   Cardiovascular:  Negative for chest pain.  Gastrointestinal:  Negative for abdominal distention, abdominal pain and blood in stool.  Endocrine: Negative for hot flashes.  Genitourinary:  Negative for difficulty urinating and frequency.   Musculoskeletal:  Negative for arthralgias.  Skin:  Negative for itching and rash.  Neurological:  Negative for extremity weakness.  Hematological:  Negative for adenopathy.  Psychiatric/Behavioral:  Negative for confusion.    MEDICAL HISTORY:  Past Medical History:  Diagnosis Date   Acid reflux disease    Adnexal mass    Anemia    Environmental  allergies    Headache    History of 2019 novel coronavirus disease (COVID-19) 05/13/2019   HSV-2 infection    Hypertension    takes amlodipine 5mg    Morbid obesity due to excess calories ([place])     SURGICAL HISTORY: Past Surgical History:  Procedure Laterality Date   COLONOSCOPY W/ POLYPECTOMY     UPPER GI ENDOSCOPY      SOCIAL HISTORY: Social History   Socioeconomic History   Marital status: Single    Spouse name: Not on file   Number of children: Not on file   Years of education: Not on file   Highest education level: Not on file  Occupational History   Not on file  Tobacco Use   Smoking status: Never   Smokeless tobacco: Never  Vaping Use   Vaping Use: Never used  Substance and Sexual Activity   Alcohol use: Not Currently    Comment: occasion   Drug use: Never   Sexual activity: Yes  Other Topics Concern   Not on file  Social History Narrative   Not on file   Social Determinants of Health   Financial Resource Strain: Not on file  Food Insecurity: Not on file  Transportation Needs: Not on file  Physical Activity: Not on file  Stress: Not on file  Social Connections: Not on file  Intimate Partner Violence: Not on file    FAMILY HISTORY: Family History  Problem Relation Age of Onset   Healthy Mother    Cancer Father     ALLERGIES:  has  No Known Allergies.  MEDICATIONS:  Current Outpatient Medications  Medication Sig Dispense Refill   amLODipine (NORVASC) 5 MG tablet Take 2 tablets (10 mg total) by mouth daily. 30 tablet 1   ferrous sulfate 325 (65 FE) MG tablet 1 tablet     montelukast (SINGULAIR) 10 MG tablet Take 10 mg by mouth at bedtime.     ibuprofen (ADVIL) 600 MG tablet Take 1 tablet (600 mg total) by mouth every 6 (six) hours as needed for mild pain, moderate pain or cramping. (Patient not taking: Reported on 06/25/2021) 30 tablet 3   sucralfate (CARAFATE) 1 g tablet Take 1 tablet (1 g total) by mouth 4 (four) times daily. (Patient not taking:  Reported on 06/25/2021) 40 tablet 0   No current facility-administered medications for this visit.     PHYSICAL EXAMINATION: ECOG PERFORMANCE STATUS: 0 - Asymptomatic Vitals:   06/25/21 1245  BP: 128/77  Pulse: 82  Resp: 18  Temp: (!) 96.6 F (35.9 C)   Filed Weights   06/25/21 1245  Weight: (!) 370 lb 3.2 oz (167.9 kg)    Physical Exam Constitutional:      General: She is not in acute distress.    Appearance: She is obese.  HENT:     Head: Normocephalic and atraumatic.  Eyes:     General: No scleral icterus. Cardiovascular:     Rate and Rhythm: Normal rate and regular rhythm.     Heart sounds: Normal heart sounds.  Pulmonary:     Effort: Pulmonary effort is normal. No respiratory distress.     Breath sounds: No wheezing.  Abdominal:     General: Bowel sounds are normal. There is no distension.     Palpations: Abdomen is soft.  Musculoskeletal:        General: No deformity. Normal range of motion.     Cervical back: Normal range of motion and neck supple.  Skin:    General: Skin is warm and dry.     Findings: No erythema or rash.  Neurological:     Mental Status: She is alert and oriented to person, place, and time. Mental status is at baseline.     Cranial Nerves: No cranial nerve deficit.     Coordination: Coordination normal.  Psychiatric:        Mood and Affect: Mood normal.    LABORATORY DATA:  I have reviewed the data as listed Lab Results  Component Value Date   WBC 9.8 06/25/2021   HGB 11.1 (L) 06/25/2021   HCT 35.1 (L) 06/25/2021   MCV 63.9 (L) 06/25/2021   PLT 229 06/25/2021   Recent Labs    08/16/20 2029 12/29/20 0030 01/08/21 1323 05/30/21 1247  NA 133* 132* 139 137  K 4.3 3.6 3.6 3.5  CL 105 104 108 105  CO2 22 19* 24 25  GLUCOSE 91 77 86 89  BUN 8 9 7 9   CREATININE 0.72 0.52 0.80 0.65  CALCIUM 9.0 9.1 9.2 9.1  GFRNONAA >60 >60 >60 >60  PROT 7.4 7.0 6.8  --   ALBUMIN 3.2* 2.9* 3.1*  --   AST 27 17 18   --   ALT 24 19 20   --    ALKPHOS 64 112 82  --   BILITOT 0.4 0.3 0.5  --    Iron/TIBC/Ferritin/ %Sat    Component Value Date/Time   IRON 52 06/25/2021 1309   TIBC 328 06/25/2021 1309   FERRITIN 47 06/25/2021 1309   IRONPCTSAT 16 06/25/2021  1309      RADIOGRAPHIC STUDIES: I have personally reviewed the radiological images as listed and agreed with the findings in the report. DG Chest 2 View  Result Date: 05/30/2021 CLINICAL DATA:  Chest pain EXAM: CHEST - 2 VIEW COMPARISON:  Chest x-ray dated 05/27/2019 FINDINGS: Heart size and mediastinal contours are within normal limits. Lungs are clear. No pleural effusion or pneumothorax is seen. Osseous structures about the chest are unremarkable. IMPRESSION: Normal chest x-ray. Electronically Signed   By: Franki Cabot M.D.   On: 05/30/2021 13:17      ASSESSMENT & PLAN:  1. Microcytic anemia    Check cbc, BMP, iron tibc ferritin, smear, retic panel. Hemoglobinopathy.  Labs are reviewed She has a hemoglobin of 11.1, mcv 63.9, iron panel showed ferritin of 47, iron saturation 16. No consistent with iron deficiency.  Suspect that she may have hemoglobinemia, testing is pending.    Orders Placed This Encounter  Procedures   Ferritin    Standing Status:   Future    Number of Occurrences:   1    Standing Expiration Date:   12/26/2021   Technologist smear review    Standing Status:   Future    Number of Occurrences:   1    Standing Expiration Date:   06/26/2022   CBC with Differential/Platelet    Standing Status:   Future    Number of Occurrences:   1    Standing Expiration Date:   06/26/2022   Iron and TIBC    Standing Status:   Future    Number of Occurrences:   1    Standing Expiration Date:   06/26/2022   Hgb Fractionation Cascade    Standing Status:   Future    Number of Occurrences:   1    Standing Expiration Date:   06/26/2022   Retic Panel    Standing Status:   Future    Number of Occurrences:   1    Standing Expiration Date:   06/26/2022    All questions  were answered. The patient knows to call the clinic with any problems questions or concerns.  cc Clelland, Delrae Rend, PA    Return of visit: TBD Thank you for this kind referral and the opportunity to participate in the care of this patient. A copy of today's note is routed to referring provider   Earlie Server, MD, PhD [facility] Hematology Oncology 06/25/2021

## 2021-06-28 LAB — HGB FRACTIONATION CASCADE
Hgb A2: 2.1 % (ref 1.8–3.2)
Hgb A: 97.9 % (ref 96.4–98.8)
Hgb F: 0 % (ref 0.0–2.0)
Hgb S: 0 %

## 2021-07-01 ENCOUNTER — Other Ambulatory Visit: Payer: Self-pay | Admitting: Oncology

## 2021-07-01 DIAGNOSIS — D509 Iron deficiency anemia, unspecified: Secondary | ICD-10-CM

## 2021-07-02 ENCOUNTER — Telehealth: Payer: Self-pay

## 2021-07-02 NOTE — Telephone Encounter (Signed)
Spoke with patient and informed of lab results. Advised of Dr. Collie Siad recommendation to do additional labs. Patient verbalized understanding.  ? ? ?Brooke, please schedule patient for labs net week. Please notify patient of appointment. Thanks.   ?

## 2021-07-02 NOTE — Telephone Encounter (Signed)
-----   Message from Earlie Server, MD sent at 07/01/2021 10:55 PM EST ----- ?Please let her know that her blood work showed no significant iron deficiency.  ?All her other blood work results were good. I recommend one more testing to find out why the size of RBC is small.  ?Please arrange lab encounter. Lab was ordered.  ?

## 2021-07-08 ENCOUNTER — Inpatient Hospital Stay: Payer: Managed Care, Other (non HMO)

## 2021-07-08 ENCOUNTER — Other Ambulatory Visit: Payer: Self-pay

## 2021-07-08 DIAGNOSIS — D509 Iron deficiency anemia, unspecified: Secondary | ICD-10-CM | POA: Diagnosis not present

## 2021-07-16 LAB — ALPHA-THALASSEMIA GENOTYPR

## 2021-07-19 ENCOUNTER — Telehealth: Payer: Self-pay

## 2021-07-19 NOTE — Telephone Encounter (Signed)
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  ?This condition normally does not cause any health problems.  ?I recommend her to have genetic counseling for family planning. ?

## 2021-07-19 NOTE — Telephone Encounter (Signed)
Pt did not answer, Detailed VM left and Mychart message sent.  ?

## 2021-07-20 DIAGNOSIS — N83209 Unspecified ovarian cyst, unspecified side: Secondary | ICD-10-CM | POA: Insufficient documentation

## 2022-01-20 ENCOUNTER — Other Ambulatory Visit: Payer: Self-pay | Admitting: Obstetrics and Gynecology

## 2022-01-20 DIAGNOSIS — R102 Pelvic and perineal pain: Secondary | ICD-10-CM

## 2022-01-24 ENCOUNTER — Other Ambulatory Visit: Payer: Managed Care, Other (non HMO)

## 2022-01-26 ENCOUNTER — Ambulatory Visit
Admission: RE | Admit: 2022-01-26 | Discharge: 2022-01-26 | Disposition: A | Discharge status: Home / Self Care | Payer: Managed Care, Other (non HMO) | Source: Ambulatory Visit | Attending: Obstetrics and Gynecology | Admitting: Obstetrics and Gynecology

## 2022-01-26 DIAGNOSIS — R102 Pelvic and perineal pain: Secondary | ICD-10-CM

## 2022-11-28 ENCOUNTER — Encounter: Payer: Self-pay | Admitting: Emergency Medicine

## 2022-11-28 ENCOUNTER — Ambulatory Visit
Admission: EM | Admit: 2022-11-28 | Discharge: 2022-11-28 | Disposition: A | Discharge status: Home / Self Care | Payer: Managed Care, Other (non HMO)

## 2022-11-28 DIAGNOSIS — R202 Paresthesia of skin: Secondary | ICD-10-CM | POA: Diagnosis present

## 2022-11-28 DIAGNOSIS — Z79899 Other long term (current) drug therapy: Secondary | ICD-10-CM | POA: Insufficient documentation

## 2022-11-28 DIAGNOSIS — N898 Other specified noninflammatory disorders of vagina: Secondary | ICD-10-CM | POA: Insufficient documentation

## 2022-11-28 DIAGNOSIS — M545 Low back pain, unspecified: Secondary | ICD-10-CM | POA: Diagnosis present

## 2022-11-28 DIAGNOSIS — K219 Gastro-esophageal reflux disease without esophagitis: Secondary | ICD-10-CM | POA: Diagnosis not present

## 2022-11-28 LAB — WET PREP, GENITAL
Clue Cells Wet Prep HPF POC: NONE SEEN
Sperm: NONE SEEN
Trich, Wet Prep: NONE SEEN
WBC, Wet Prep HPF POC: <10 — AB (ref <10)
Yeast Wet Prep HPF POC: NONE SEEN

## 2022-11-28 MED ORDER — BACLOFEN 10 MG PO TABS: 10.0000 mg | ORAL_TABLET | Freq: Three times a day (TID) | ORAL | 0 refills | Status: DC

## 2022-11-28 MED ORDER — DEXLANSOPRAZOLE 30 MG PO CPDR: 30.0000 mg | DELAYED_RELEASE_CAPSULE | Freq: Every day | ORAL | 0 refills | Status: DC

## 2022-11-28 NOTE — Discharge Instructions (Signed)
Please stop the cyclobenzaprine and switch to baclofen.  You can take this up to 3 times a day as tolerated.  It may make you slightly drowsy. I would also recommend warm compresses and the Salonpas lidocaine patch.  You can apply this to the area of your back that is affected most.  Please stop your pantoprazole and switch to Dexilant.  You will take this 30 minutes before breakfast every morning.  Your tingling sensation in your extremities may be secondary to B12 deficiency which is extremely common for people on PPI therapy.  Please discuss this with your primary care physician to have it checked.  You can also try supplementation with 1000 mg sublingual B12.

## 2022-11-28 NOTE — ED Triage Notes (Signed)
Pt presents with back pain and rib cage pain especially when she coughs x 1 week. Pt had a tele-visit and was prescribed a muscle relaxer. Pt is also concerned about BV.

## 2022-11-28 NOTE — ED Provider Notes (Signed)
[facility]    CSN: [id number] Arrival date & time: 11/28/22  1730      History   Chief Complaint Chief Complaint  Patient presents with   Back Pain   Abdominal Pain    HPI Charlene Gregory is a 37 y.o. female.   Pleasant 37 year old female presents today due to concerns of a midline lower back pain has been present for the past week.  She states she did a telehealth visit and was prescribed cyclobenzaprine, but is unable to take it due to severe sedation afterwards.  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  She admits to slouching forward and feels that her position has worsened her lower back discomfort.  She denies radicular symptoms or urinary symptoms. Additionally patient states she has GERD.  She follows with a specialist for this.  She takes pantoprazole but feels like she is in a "flareup".  She has had an EGD in the past that was unremarkable per patient.  She feels like she is burping more than normal.  She has taken Dexilant in the past and felt that this was better for her symptoms.  She denies abdominal extension, epigastric pain, excess flatulence, or bloating. Patient does have concern about tingling she just started noticing in her fingers. Lastly patient states she would like to be tested for BV.  She had a single episode of a mucousy bloody discharge a week or 2 ago.  She states this was unrelated to her normal menstrual period which occurred around July 16.  She does have a follow-up with her gynecologist in 3 days.  She denies any severe pelvic pain.   Back Pain Associated symptoms: abdominal pain   Abdominal Pain   Past Medical History:  Diagnosis Date   Acid reflux disease    Adnexal mass    Anemia    Environmental allergies    Headache    History of 2019 novel coronavirus disease (COVID-19) 05/13/2019   HSV-2 infection    Hypertension    takes amlodipine 5mg    Morbid obesity due to excess calories [facility])     Patient Active  Problem List   Diagnosis Date Noted   Infectious mononucleosis 12/25/2020   Body mass index (BMI) 60.0-69.9, adult (HCC) 12/25/2020   Parvovirus infection 12/25/2020   Vitamin D deficiency 12/25/2020   Chronic hypertension affecting pregnancy 12/25/2020   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/25/2020   Iron deficiency anemia due to chronic blood loss 02/03/2020   Anemia 07/19/2019   Allergic rhinitis 06/21/2019   Benign hypertension 06/21/2019   Chronic migraine without aura without status migrainosus, not intractable 06/21/2019   Gastroesophageal reflux disease 06/21/2019    Past Surgical History:  Procedure Laterality Date   COLONOSCOPY W/ POLYPECTOMY     UPPER GI ENDOSCOPY      OB History     Gravida  1   Para  1   Term  1   Preterm      AB      Living  1      SAB      IAB      Ectopic      Multiple  0   Live Births  1            Home Medications    Prior to Admission medications   Medication Sig Start Date End Date Taking? Authorizing Provider  amLODipine (NORVASC) 5 MG tablet Take 2 tablets (10  mg total) by mouth daily. 01/08/21  Yes Rasch, Harolyn Rutherford, NP  baclofen (LIORESAL) 10 MG tablet Take 1 tablet (10 mg total) by mouth 3 (three) times daily. 11/28/22  Yes Anani Gu L, PA  Dexlansoprazole (DEXILANT) 30 MG capsule DR Take 1 capsule (30 mg total) by mouth daily. 11/28/22  Yes William Schake L, PA  ferrous sulfate 325 (65 FE) MG tablet 1 tablet    [provider]  montelukast (SINGULAIR) 10 MG tablet Take 10 mg by mouth at bedtime.    [provider]  omeprazole (PRILOSEC) 20 MG capsule Take 1 capsule (20 mg total) by mouth daily. 05/24/19 09/20/19  Payton Mccallum, MD    Family History Family History  Problem Relation Age of Onset   Healthy Mother    Cancer Father     Social History Social History   Tobacco Use   Smoking status: Never   Smokeless tobacco: Never  Vaping Use   Vaping status: Never Used   Substance Use Topics   Alcohol use: Not Currently    Comment: occasion   Drug use: Never     Allergies   Patient has no known allergies.   Review of Systems Review of Systems  Gastrointestinal:  Positive for abdominal pain.  Musculoskeletal:  Positive for back pain.  As per HPI   Physical Exam Triage Vital Signs ED Triage Vitals  Encounter Vitals Group     BP 11/28/22 1757 131/88     Systolic BP Percentile --      Diastolic BP Percentile --      Pulse Rate 11/28/22 1757 97     Resp 11/28/22 1757 18     Temp 11/28/22 1757 98.5 F (36.9 C)     Temp Source 11/28/22 1757 Oral     SpO2 11/28/22 1757 100 %     Weight --      Height --      Head Circumference --      Peak Flow --      Pain Score 11/28/22 1755 0     Pain Loc --      Pain Education --      Exclude from Growth Chart --    No data found.  Updated Vital Signs BP 131/88 (BP Location: Right Wrist)   Pulse 97   Temp 98.5 F (36.9 C) (Oral)   Resp 18   LMP 11/03/2022   SpO2 100%   Visual Acuity Right Eye Distance:   Left Eye Distance:   Bilateral Distance:    Right Eye Near:   Left Eye Near:    Bilateral Near:     Physical Exam Vitals and nursing note reviewed.  Constitutional:      Appearance: She is well-developed. She is obese.  HENT:     Head: Normocephalic and atraumatic.     Mouth/Throat:     Mouth: Mucous membranes are moist.     Pharynx: No pharyngeal swelling.  Eyes:     General: No scleral icterus.    Extraocular Movements: Extraocular movements intact.     Pupils: Pupils are equal, round, and reactive to light.  Cardiovascular:     Rate and Rhythm: Normal rate and regular rhythm.     Heart sounds: Normal heart sounds. No murmur heard.    No friction rub. No gallop.  Pulmonary:     Effort: Pulmonary effort is normal. No respiratory distress.     Breath sounds: Normal breath sounds. No stridor. No wheezing, rhonchi  or rales.  Chest:     Chest wall: No tenderness.   Abdominal:     Palpations: Abdomen is soft.  Genitourinary:    Comments: Exam deferred Musculoskeletal:     Thoracic back: No tenderness or bony tenderness. Normal range of motion.     Lumbar back: Tenderness (muscular bilaterally) present. No swelling, deformity, spasms or bony tenderness. Normal range of motion.       Back:  Skin:    General: Skin is warm and dry.     Capillary Refill: Capillary refill takes less than 2 seconds.     Coloration: Skin is not cyanotic or pale.     Findings: No rash.  Neurological:     General: No focal deficit present.     Mental Status: She is alert and oriented to person, place, and time.     Cranial Nerves: No cranial nerve deficit.     Motor: No weakness.      UC Treatments / Results  Labs (all labs ordered are listed, but only abnormal results are displayed) Labs Reviewed  WET PREP, GENITAL - Abnormal; Notable for the following components:      Result Value   WBC, Wet Prep HPF POC <10 (*)    All other components within normal limits    EKG   Radiology No results found.  Procedures Procedures (including critical care time)  Medications Ordered in UC Medications - No data to display  Initial Impression / Assessment and Plan / UC Course  I have reviewed the triage vital signs and the nursing notes.  Pertinent labs & imaging results that were available during my care of the patient were reviewed by me and considered in my medical decision making (see chart for details).     Acute midline low back pain -will DC cyclobenzaprine and switch patient to baclofen.  I also recommended she try Salonpas lidocaine patch.  Discussed proper posture.  Moderate weight loss would also be beneficial. GERD -this does not appear to be well-controlled at this time.  Patient had significant improvement on Dexilant in the past therefore we will DC pantoprazole and switch to Dexilant daily. Tingling in extremities -we will defer this workup to her  PCP although I did mention she might want to consider a B12 evaluation given the fact that she is on chronic PPI therapy Vaginal discharge -wet prep unremarkable.  She does have a follow-up with her gynecologist scheduled for 3 days from now.   Final Clinical Impressions(s) / UC Diagnoses   Final diagnoses:  Acute midline low back pain without sciatica  Gastroesophageal reflux disease without esophagitis  Tingling in extremities  Vaginal discharge     Discharge Instructions      Please stop the cyclobenzaprine and switch to baclofen.  You can take this up to 3 times a day as tolerated.  It may make you slightly drowsy. I would also recommend warm compresses and the Salonpas lidocaine patch.  You can apply this to the area of your back that is affected most.  Please stop your pantoprazole and switch to Dexilant.  You will take this 30 minutes before breakfast every morning.  Your tingling sensation in your extremities may be secondary to B12 deficiency which is extremely common for people on PPI therapy.  Please discuss this with your primary care physician to have it checked.  You can also try supplementation with 1000 mg sublingual B12.       ED Prescriptions  Medication Sig Dispense Auth. Provider   Dexlansoprazole (DEXILANT) 30 MG capsule DR Take 1 capsule (30 mg total) by mouth daily. 30 capsule Jerzie Bieri L, PA   baclofen (LIORESAL) 10 MG tablet Take 1 tablet (10 mg total) by mouth 3 (three) times daily. 30 each Desarea Ohagan L, PA      PDMP not reviewed this encounter.   Maretta Bees, [place] 11/28/22 1916

## 2022-12-09 ENCOUNTER — Other Ambulatory Visit: Payer: Self-pay | Admitting: Nurse Practitioner

## 2022-12-09 DIAGNOSIS — N939 Abnormal uterine and vaginal bleeding, unspecified: Secondary | ICD-10-CM

## 2023-08-04 ENCOUNTER — Ambulatory Visit

## 2023-09-26 ENCOUNTER — Ambulatory Visit: Admitting: Family Medicine

## 2023-09-26 ENCOUNTER — Encounter: Payer: Self-pay | Admitting: Family Medicine

## 2023-09-26 VITALS — BP 125/81 | HR 86 | Temp 97.9°F | Resp 18 | Ht 65.0 in | Wt 346.0 lb

## 2023-09-26 DIAGNOSIS — Z8669 Personal history of other diseases of the nervous system and sense organs: Secondary | ICD-10-CM | POA: Diagnosis not present

## 2023-09-26 DIAGNOSIS — Z820 Family history of epilepsy and other diseases of the nervous system: Secondary | ICD-10-CM | POA: Insufficient documentation

## 2023-09-26 DIAGNOSIS — I10 Essential (primary) hypertension: Secondary | ICD-10-CM

## 2023-09-26 MED ORDER — ZEPBOUND 7.5 MG/0.5ML ~~LOC~~ SOAJ: 7.5000 mg | SUBCUTANEOUS | 0 refills | Status: DC

## 2023-09-26 MED ORDER — AMLODIPINE BESYLATE 10 MG PO TABS: 10.0000 mg | ORAL_TABLET | Freq: Every day | ORAL | 1 refills | Status: AC

## 2023-09-26 NOTE — Assessment & Plan Note (Signed)
 BMI 57+.  Lost from 407 down to 346 with meal planning and exercise.  Would like to continue Zepbound.  Is currently taking 5 mg dose.  Has minimal nausea and constipation.  Uses Dulcolax for constipation.  Will increase to 7.5 mg weekly.  Advised if she has abdominal pain she needs to be seen because to rare side effects are cholecystitis and pancreatitis.  Follow-up monthly.

## 2023-09-26 NOTE — Assessment & Plan Note (Signed)
 09/22/2023 MRI of the brain with and without contrast showed no ischemic changes.  2 small nonspecific foci of nonenhancing T2 weighted and FLAIR hyperintensity left cerebral hemisphere most probably related to headache/migraine.  Will refer to neurology for migraine treatment.

## 2023-09-26 NOTE — Progress Notes (Signed)
 New Patient Office Visit  Subjective    Patient ID: Charlene Gregory, female    DOB: 1985-07-06  Age: 38 y.o. MRN: [id number]  CC:  Chief Complaint  Patient presents with   Establish Care   Migraine    HPI Charlene Gregory presents to establish care Delightful 38 year old woman with morbid obesity, GERD, alpha thalassemia minor (causes anemia), HTN, adnexal mass, environmental allergies, HSV,  s/p unilateral salpingo oophorectomy with contralateral salpingectomy.        She has been having bad migraines this week and saw her primary care physician who started her on a steroid Dosepak suspecting that her headache was part of an allergy complex.  The prednisone  taper did not help so she set her up for an MRI.  09/22/2023 MRI of the brain with and without contrast showed no ischemic changes 2 small nonspecific foci of nonenhancing T2 weighted and FLAIR hyperintensity left cerebral hemisphere most probably related to headache/migraine.  She reports she has been having a headache most every day.  She would like to follow-up with neurology.      She had a colonoscopy 09/17/2019 with polypectomy and an EGD for iron deficiency anemia.  She had patchy mildly erythematous  gastric mucosa without bleeding. She is to repeat her colonoscopy in 5 years.        Last Pap 08/13/2022 NILM, HRHPV negative. She had a pregnancy 3 years ago and had no hypertension or diabetes during her pregnancy.  Her hypertension started after her pregnancy.  She is on amlodipine  10 mg daily.  Reports no side effects from this medication. She is on Zepbound 5 mg weekly for weight loss.  She has not been able to get this consistently and is lost from 407 down to 346 mostly by working out at the gym 3-4 times a week for 45 minutes to an hour and doing meal prep keto diet.  She really tries to avoid carbohydrates.  She would like to stay on Zepbound if possible.      She has never smoked.  Maybe once every 2 to 3 months she will have a  single drink and no drugs. Her father died of cancer.  No family history of diabetes.   Outpatient Encounter Medications as of 09/26/2023  Medication Sig   cetirizine (ZYRTEC) 10 MG tablet Take 10 mg by mouth daily.   Cholecalciferol 1.25 MG (50000 UT) capsule Take 1 capsule by mouth once a week.   montelukast (SINGULAIR) 10 MG tablet Take 10 mg by mouth at bedtime.   pantoprazole (PROTONIX) 40 MG tablet Take 40 mg by mouth daily.   tirzepatide (ZEPBOUND) 7.5 MG/0.5ML Pen Inject 7.5 mg into the skin once a week.   [DISCONTINUED] amLODipine  (NORVASC ) 10 MG tablet Take 10 mg by mouth daily.   [DISCONTINUED] tirzepatide (ZEPBOUND) 5 MG/0.5ML Pen Inject 5 mg into the skin once a week.   amLODipine  (NORVASC ) 10 MG tablet Take 1 tablet (10 mg total) by mouth daily.   [DISCONTINUED] amLODipine  (NORVASC ) 5 MG tablet Take 2 tablets (10 mg total) by mouth daily.   [DISCONTINUED] baclofen  (LIORESAL ) 10 MG tablet Take 1 tablet (10 mg total) by mouth 3 (three) times daily.   [DISCONTINUED] Dexlansoprazole  (DEXILANT ) 30 MG capsule DR Take 1 capsule (30 mg total) by mouth daily.   [DISCONTINUED] ferrous sulfate 325 (65 FE) MG tablet 1 tablet   [DISCONTINUED] montelukast (SINGULAIR) 10 MG tablet Take 10 mg by mouth at bedtime.   [DISCONTINUED] omeprazole  (PRILOSEC) 20 MG  capsule Take 1 capsule (20 mg total) by mouth daily.   No facility-administered encounter medications on file as of 09/26/2023.    Past Medical History:  Diagnosis Date   Acid reflux disease    Adnexal mass    Anemia    Environmental allergies    Headache    History of 2019 novel coronavirus disease (COVID-19) 05/13/2019   HSV-2 infection    Hypertension    takes amlodipine  5mg    Morbid obesity due to excess calories [facility])     Past Surgical History:  Procedure Laterality Date   COLONOSCOPY W/ POLYPECTOMY     UPPER GI ENDOSCOPY      Family History  Problem Relation Age of Onset   Healthy Mother    Cancer Father      Social History   Socioeconomic History   Marital status: Single    Spouse name: Not on file   Number of children: Not on file   Years of education: Not on file   Highest education level: Not on file  Occupational History   Not on file  Tobacco Use   Smoking status: Never    Passive exposure: Never   Smokeless tobacco: Never  Vaping Use   Vaping status: Never Used  Substance and Sexual Activity   Alcohol use: Not Currently    Comment: occasion   Drug use: Never   Sexual activity: Yes  Other Topics Concern   Not on file  Social History Narrative   Not on file   Social Drivers of Health   Financial Resource Strain: Not on file  Food Insecurity: Not on file  Transportation Needs: Not on file  Physical Activity: Not on file  Stress: Not on file (03/01/2023)  Social Connections: Not on file  Intimate Partner Violence: Not At Risk (09/09/2023)   [facility]   Humiliation, Afraid, Rape, and Kick questionnaire    Fear of Current or Ex-Partner: No    Emotionally Abused: No    Physically Abused: No    Sexually Abused: No    ROS      Objective   BP 125/81 (BP Location: Left Arm, Patient Position: Sitting, Cuff Size: Large)   Pulse 86   Temp 97.9 F (36.6 C) (Oral)   Resp 18   Ht 5\' 5"  (1.651 m)   Wt (!) 346 lb (156.9 kg)   LMP 09/13/2023 (Exact Date)   SpO2 98%   Breastfeeding No   BMI 57.58 kg/m    Physical Exam Vitals and nursing note reviewed.  Constitutional:      Appearance: Normal appearance.  HENT:     Head: Normocephalic and atraumatic.  Eyes:     Conjunctiva/sclera: Conjunctivae normal.  Cardiovascular:     Rate and Rhythm: Normal rate and regular rhythm.  Pulmonary:     Effort: Pulmonary effort is normal.     Breath sounds: Normal breath sounds.  Musculoskeletal:     Right lower leg: No edema.     Left lower leg: No edema.  Skin:    General: Skin is warm and dry.  Neurological:     Mental Status: She is alert and  oriented to person, place, and time.  Psychiatric:        Mood and Affect: Mood normal.        Behavior: Behavior normal.        Thought Content: Thought content normal.        Judgment: Judgment normal.  The ASCVD Risk score (Arnett DK, et al., 2019) failed to calculate for the following reasons:   The 2019 ASCVD risk score is only valid for ages 16 to 58     Assessment & Plan:  Primary hypertension -     amLODIPine  Besylate; Take 1 tablet (10 mg total) by mouth daily.  Dispense: 90 tablet; Refill: 1  Morbid obesity (HCC) Assessment & Plan: BMI 57+.  Lost from 407 down to 346 with meal planning and exercise.  Would like to continue Zepbound.  Is currently taking 5 mg dose.  Has minimal nausea and constipation.  Uses Dulcolax for constipation.  Will increase to 7.5 mg weekly.  Advised if she has abdominal pain she needs to be seen because to rare side effects are cholecystitis and pancreatitis.  Follow-up monthly.  Orders: -     Zepbound; Inject 7.5 mg into the skin once a week.  Dispense: 2 mL; Refill: 0  History of migraine headaches -     Ambulatory referral to Neurology  FHx: migraine headaches Assessment & Plan: 09/22/2023 MRI of the brain with and without contrast showed no ischemic changes.  2 small nonspecific foci of nonenhancing T2 weighted and FLAIR hyperintensity left cerebral hemisphere most probably related to headache/migraine.  Will refer to neurology for migraine treatment.     Return in about 6 weeks (around 11/07/2023).   Naod Sweetland K Fallyn Munnerlyn, MD

## 2023-09-26 NOTE — Assessment & Plan Note (Signed)
 Amlodipine  10 mg daily without side effects and blood pressures 125/81.

## 2023-09-27 ENCOUNTER — Encounter: Payer: Self-pay | Admitting: Family Medicine

## 2023-09-27 MED ORDER — ZEPBOUND 7.5 MG/0.5ML ~~LOC~~ SOAJ: 7.5000 mg | SUBCUTANEOUS | 0 refills | Status: DC

## 2023-09-28 ENCOUNTER — Other Ambulatory Visit: Payer: Self-pay | Admitting: Family Medicine

## 2023-09-28 DIAGNOSIS — G43809 Other migraine, not intractable, without status migrainosus: Secondary | ICD-10-CM

## 2023-09-28 MED ORDER — SUMATRIPTAN SUCCINATE 25 MG PO TABS
ORAL_TABLET | ORAL | 1 refills | Status: AC
Start: 2023-09-28 — End: ?

## 2023-10-04 ENCOUNTER — Telehealth: Payer: Self-pay

## 2023-10-04 NOTE — Telephone Encounter (Signed)
(  Key: [id number])  Zepbound  7.5MG /0.5ML pen-injectors  Form Consulting civil engineer Form

## 2023-10-04 NOTE — Telephone Encounter (Signed)
 Copied from CRM [id number]. Topic: Clinical - Medication Prior Auth >> Oct 04, 2023  2:45 PM Stanly Early wrote: Reason for CRM: patient is calling for a PA for zepound. It stated in her chart to do so. The message was from her provider. Patient stated she has one injection for the 0.5

## 2023-10-07 ENCOUNTER — Encounter: Payer: Self-pay | Admitting: Family Medicine

## 2023-11-08 ENCOUNTER — Encounter: Payer: Self-pay | Admitting: Family Medicine

## 2023-11-08 ENCOUNTER — Ambulatory Visit (INDEPENDENT_AMBULATORY_CARE_PROVIDER_SITE_OTHER): Admitting: Family Medicine

## 2023-11-08 VITALS — BP 126/85 | HR 82 | Temp 98.2°F | Resp 18 | Ht 65.0 in | Wt 341.0 lb

## 2023-11-08 DIAGNOSIS — I10 Essential (primary) hypertension: Secondary | ICD-10-CM

## 2023-11-08 MED ORDER — ZEPBOUND 10 MG/0.5ML ~~LOC~~ SOAJ
10.0000 mg | SUBCUTANEOUS | 0 refills | Status: DC
Start: 2023-11-08 — End: 2023-11-09

## 2023-11-08 NOTE — Progress Notes (Signed)
 Established Patient Office Visit  Subjective   Patient ID: Charlene Gregory, female    DOB: 03-16-1986  Age: 37 y.o. MRN: [id number]  Chief Complaint  Patient presents with   Medical Management of Chronic Issues    HPI Delightful 38 year old woman with morbid obesity, GERD, alpha thalassemia minor (causes anemia), HTN, adnexal mass, environmental allergies, HSV,  migraine Has, s/p unilateral salpingo oophorectomy with contralateral salpingectomy, colonoscopy 09/17/2019 (5 year FOLLOW-UP). Her insurance was not going to pay for her to have Zepbound  any longer so she found another way to get this.  She is on Zepbound  7.5 mg weekly.  She is having some constipation and has been taking Colace.  Advised if this is not sufficient she should try MiraLAX 70 g daily.  She reports that she has diarrhea the day after her shot and she belches quite a bit.  She does not really get nauseated.  Since 09/26/2023 she has lost 5 pounds.  She is working on [employer] so that she can avoid carbohydrates.  She skips breakfast most days.  She is getting lots of protein by eating salmon, other fish and chicken.  She is trying to exercise but has been too hot to go outside and walk.Charlene Gregory is a 38 year old female who presents for medication management and follow-up.  She has been managing her medication through [facility] and [facility], which required enrollment in a wellness program. This has allowed her to obtain her medication at a reduced cost of twenty-five dollars.  She is currently taking 7.5 mg of her medication and has had three doses so far. Since starting this regimen, she has lost five pounds in the last four weeks.  She experiences ongoing issues with constipation, which remains a struggle for her.         Objective:     BP 126/85 (BP Location: Left Arm, Patient Position: Sitting, Cuff Size: Large)   Pulse 82   Temp 98.2 F (36.8 C) (Oral)   Resp 18   Ht 5' 5 (1.651 m)   Wt (!) 341  lb (154.7 kg)   SpO2 98%   BMI 56.75 kg/m    Physical Exam Vitals reviewed.  Constitutional:      Appearance: Normal appearance.  HENT:     Head: Normocephalic.  Eyes:     General:        Right eye: No discharge.        Left eye: No discharge.  Cardiovascular:     Rate and Rhythm: Normal rate.  Pulmonary:     Effort: Pulmonary effort is normal.  Neurological:     Mental Status: She is alert and oriented to person, place, and time.  Psychiatric:        Mood and Affect: Mood normal.        Behavior: Behavior normal.        Thought Content: Thought content normal.        Judgment: Judgment normal.          No results found for any visits on 11/08/23.    The ASCVD Risk score (Arnett DK, et al., 2019) failed to calculate for the following reasons:   The 2019 ASCVD risk score is only valid for ages 87 to 62    Assessment & Plan:  Primary hypertension -     CMP14+EGFR  Morbid obesity (HCC) Assessment & Plan: She is doing well with meal prepping.  She is on Zepbound  7.5mg  weekly and  is having minimal symptoms.  Will increase Zepbound  to 10 mg weekly.  Please try to increase your exercise.  Checking CMP today for protein and albumin levels.  Orders: -     Zepbound ; Inject 10 mg into the skin once a week.  Dispense: 2 mL; Refill: 0  Benign hypertension Assessment & Plan: Blood pressure well-controlled on amlodipine  10 mg daily.      No follow-ups on file.    Kosta Schnitzler K Cherita Hebel, MD

## 2023-11-08 NOTE — Assessment & Plan Note (Addendum)
 She is doing well with meal prepping.  She is on Zepbound  7.5mg  weekly and is having minimal symptoms.  Will increase Zepbound  to 10 mg weekly.  Please try to increase your exercise.  Checking CMP today for protein and albumin levels.

## 2023-11-08 NOTE — Assessment & Plan Note (Signed)
Blood pressure well controlled on amlodipine 10 mg daily.

## 2023-11-09 ENCOUNTER — Ambulatory Visit: Payer: Self-pay | Admitting: Family Medicine

## 2023-11-09 ENCOUNTER — Encounter: Payer: Self-pay | Admitting: Family Medicine

## 2023-11-09 LAB — CMP14+EGFR
ALT: 8 IU/L (ref 0–32)
AST: 12 IU/L (ref 0–40)
Albumin: 3.8 g/dL — ABNORMAL LOW (ref 3.9–4.9)
Alkaline Phosphatase: 86 IU/L (ref 44–121)
BUN/Creatinine Ratio: 18 (ref 9–23)
BUN: 11 mg/dL (ref 6–20)
Bilirubin Total: 0.2 mg/dL (ref 0.0–1.2)
CO2: 16 mmol/L — ABNORMAL LOW (ref 20–29)
Calcium: 8.9 mg/dL (ref 8.7–10.2)
Chloride: 103 mmol/L (ref 96–106)
Creatinine, Ser: 0.6 mg/dL (ref 0.57–1.00)
Globulin, Total: 3.5 g/dL (ref 1.5–4.5)
Glucose: 81 mg/dL (ref 70–99)
Potassium: 4.2 mmol/L (ref 3.5–5.2)
Sodium: 137 mmol/L (ref 134–144)
Total Protein: 7.3 g/dL (ref 6.0–8.5)
eGFR: 119 mL/min/1.73 (ref >59)

## 2023-11-09 MED ORDER — ZEPBOUND 10 MG/0.5ML ~~LOC~~ SOAJ
10.0000 mg | SUBCUTANEOUS | 0 refills | Status: DC
Start: 2023-11-09 — End: 2023-12-22

## 2023-11-20 ENCOUNTER — Encounter: Payer: Self-pay | Admitting: Family Medicine

## 2023-11-20 NOTE — Addendum Note (Signed)
 Addended by: RAYANN REXENE HERO on: 11/20/2023 09:35 AM   Modules accepted: Orders

## 2023-11-28 ENCOUNTER — Ambulatory Visit (INDEPENDENT_AMBULATORY_CARE_PROVIDER_SITE_OTHER): Admitting: Family Medicine

## 2023-11-28 ENCOUNTER — Encounter: Payer: Self-pay | Admitting: Family Medicine

## 2023-11-28 VITALS — BP 126/84 | HR 96 | Temp 98.5°F | Resp 18 | Ht 65.0 in | Wt 347.0 lb

## 2023-11-28 DIAGNOSIS — O10019 Pre-existing essential hypertension complicating pregnancy, unspecified trimester: Secondary | ICD-10-CM | POA: Insufficient documentation

## 2023-11-28 DIAGNOSIS — K219 Gastro-esophageal reflux disease without esophagitis: Secondary | ICD-10-CM

## 2023-11-28 DIAGNOSIS — R002 Palpitations: Secondary | ICD-10-CM | POA: Diagnosis not present

## 2023-11-28 DIAGNOSIS — J45909 Unspecified asthma, uncomplicated: Secondary | ICD-10-CM | POA: Insufficient documentation

## 2023-11-28 DIAGNOSIS — D563 Thalassemia minor: Secondary | ICD-10-CM | POA: Insufficient documentation

## 2023-11-28 DIAGNOSIS — D500 Iron deficiency anemia secondary to blood loss (chronic): Secondary | ICD-10-CM | POA: Diagnosis not present

## 2023-11-28 DIAGNOSIS — Z9109 Other allergy status, other than to drugs and biological substances: Secondary | ICD-10-CM | POA: Insufficient documentation

## 2023-11-28 DIAGNOSIS — R3589 Other polyuria: Secondary | ICD-10-CM | POA: Insufficient documentation

## 2023-11-28 DIAGNOSIS — N760 Acute vaginitis: Secondary | ICD-10-CM

## 2023-11-28 DIAGNOSIS — J4521 Mild intermittent asthma with (acute) exacerbation: Secondary | ICD-10-CM | POA: Insufficient documentation

## 2023-11-28 LAB — POCT URINALYSIS DIP (CLINITEK)
Bilirubin, UA: NEGATIVE
Blood, UA: NEGATIVE
Glucose, UA: NEGATIVE mg/dL
Ketones, POC UA: NEGATIVE mg/dL
Leukocytes, UA: NEGATIVE
Nitrite, UA: NEGATIVE
POC PROTEIN,UA: NEGATIVE
Spec Grav, UA: 1.02 (ref 1.010–1.025)
Urobilinogen, UA: 0.2 U/dL
pH, UA: 6 (ref 5.0–8.0)

## 2023-11-28 MED ORDER — PANTOPRAZOLE SODIUM 40 MG PO TBEC: 40.0000 mg | DELAYED_RELEASE_TABLET | Freq: Two times a day (BID) | ORAL | 3 refills | Status: DC

## 2023-11-28 NOTE — Assessment & Plan Note (Signed)
 In the last week she has had palpitations and the sensation of a skipped beat at times it was related to her reflux.  Her reflux has been a lot worse in the last week despite the fact that she takes pantoprazole  40 mg daily.  EKG in the office shows a heart rate of 91 and normal sinus rhythm without ST or T wave changes.  Will check CBC and thyroid panel.  Just had a CMP that was normal.

## 2023-11-28 NOTE — Assessment & Plan Note (Signed)
 Protonix  40 mg daily is not controlling her symptoms.  Still having reflux and belching.  Will increase to pantoprazole  40 mg twice daily.  Saw GI and had endoscopy 09/17/2019 and had erythematous mucosa in the antrum at that time.  May need a referral to GI if we cannot control her symptoms.

## 2023-11-28 NOTE — Progress Notes (Signed)
 Established Patient Office Visit  Subjective   Patient ID: Charlene Gregory, female    DOB: 1985/10/08  Age: 38 y.o. MRN: [id number]  Chief Complaint  Patient presents with   Gastroesophageal Reflux    Gastroesophageal Reflux    Delightful 38 year old woman with morbid obesity, GERD, alpha thalassemia minor (causes anemia), HTN, adnexal mass, environmental allergies, HSV, migraine headaches, s/p unilateral salpingo-oophorectomy with contralateral salpingectomy, colonoscopy 09/17/2019 (5-year follow-up). She is concerned she might have a UTI because she has had an odor to her urine.  She denies vaginal discharge itching or burning.  She does endorse a fishy odor. She was taking Zepbound  for her weight but she has not been able to find it at a pharmacy where she can afford it.  She is going to try GoodRx and see if that helps.  Her current co-pays $117. She has been having a lot of heartburn and belching.  She regurg is up food at times she also reports no abdominal pain after eating and no epigastric pain.  She does not smoke.  She has a history of having an upper endoscopy.  She is currently on pantoprazole  40 mg daily and it is not controlling the belching and heartburn. She is having sensations of a skipped beat and palpitations.  She denies any shortness of breath or chest pain when she has the skipped beat or the palpitations.  Upper endoscopy 09/17/2019: Erythematous mucosa in the antrum. Colonoscopy 09/17/2019 for iron deficiency anemia: 6 mm polyp in the rectal in the sigmoid colon (repeat colonoscopy in 5 years)  ROS    Objective:     BP 126/84   Pulse 96   Temp 98.5 F (36.9 C) (Oral)   Resp 18   Ht 5' 5 (1.651 m)   Wt (!) 347 lb (157.4 kg)   SpO2 98%   BMI 57.74 kg/m    Physical Exam Vitals and nursing note reviewed.  Constitutional:      Appearance: Normal appearance.  HENT:     Head: Normocephalic and atraumatic.  Eyes:     Conjunctiva/sclera: Conjunctivae  normal.  Cardiovascular:     Rate and Rhythm: Normal rate and regular rhythm.  Pulmonary:     Effort: Pulmonary effort is normal.     Breath sounds: Normal breath sounds.  Abdominal:     General: Abdomen is flat. Bowel sounds are normal.     Palpations: Abdomen is soft.     Tenderness: There is no abdominal tenderness.  Musculoskeletal:     Right lower leg: No edema.     Left lower leg: No edema.  Skin:    General: Skin is warm and dry.  Neurological:     Mental Status: She is alert and oriented to person, place, and time.  Psychiatric:        Mood and Affect: Mood normal.        Behavior: Behavior normal.        Thought Content: Thought content normal.        Judgment: Judgment normal.          Results for orders placed or performed in visit on 11/28/23  POCT URINALYSIS DIP (CLINITEK)  Result Value Ref Range   Color, UA yellow yellow   Clarity, UA clear clear   Glucose, UA negative negative mg/dL   Bilirubin, UA negative negative   Ketones, POC UA negative negative mg/dL   Spec Grav, UA 8.979 8.989 - 1.025   Blood, UA negative negative  pH, UA 6.0 5.0 - 8.0   POC PROTEIN,UA negative negative, trace   Urobilinogen, UA 0.2 0.2 or 1.0 E.U./dL   Nitrite, UA Negative Negative   Leukocytes, UA Negative Negative      The ASCVD Risk score (Arnett DK, et al., 2019) failed to calculate for the following reasons:   The 2019 ASCVD risk score is only valid for ages 58 to 48    Assessment & Plan:  Polyuria Assessment & Plan: Urine sample is completely benign.  Specific gravity is 1.020.  May need to increase hydration.  Because she is complaining of a fishy odor will check a NuSwab for BV.    Orders: -     POCT URINALYSIS DIP (CLINITEK)  Palpitations with regular cardiac rhythm -     CBC with Differential/Platelet -     TSH + free T4 -     EKG 12-Lead -     Magnesium  Acute vaginitis -     NuSwab Vaginitis Plus (VG+)  Palpitations Assessment & Plan: In the  last week she has had palpitations and the sensation of a skipped beat at times it was related to her reflux.  Her reflux has been a lot worse in the last week despite the fact that she takes pantoprazole  40 mg daily.  EKG in the office shows a heart rate of 91 and normal sinus rhythm without ST or T wave changes.  Will check CBC and thyroid panel.  Just had a CMP that was normal.   Gastroesophageal reflux disease, unspecified whether esophagitis present Assessment & Plan: Protonix  40 mg daily is not controlling her symptoms.  Still having reflux and belching.  Will increase to pantoprazole  40 mg twice daily.  Saw GI and had endoscopy 09/17/2019 and had erythematous mucosa in the antrum at that time.  May need a referral to GI if we cannot control her symptoms.  Orders: -     Pantoprazole  Sodium; Take 1 tablet (40 mg total) by mouth 2 (two) times daily.  Dispense: 60 tablet; Refill: 3     No follow-ups on file.    Lyndsey Demos K Indio Santilli, MD

## 2023-11-28 NOTE — Assessment & Plan Note (Addendum)
 Urine sample is completely benign.  Specific gravity is 1.020.  May need to increase hydration.  Because she is complaining of a fishy odor will check a NuSwab for BV.

## 2023-11-29 ENCOUNTER — Other Ambulatory Visit: Payer: Self-pay | Admitting: Family Medicine

## 2023-11-29 DIAGNOSIS — D508 Other iron deficiency anemias: Secondary | ICD-10-CM

## 2023-11-29 LAB — CBC WITH DIFFERENTIAL/PLATELET
Basophils Absolute: 0.1 x10E3/uL (ref 0.0–0.2)
Basos: 1 %
EOS (ABSOLUTE): 0.2 x10E3/uL (ref 0.0–0.4)
Eos: 3 %
Hematocrit: 28.6 % — ABNORMAL LOW (ref 34.0–46.6)
Hemoglobin: 7.2 g/dL — ABNORMAL LOW (ref 11.1–15.9)
Immature Grans (Abs): 0.1 x10E3/uL (ref 0.0–0.1)
Immature Granulocytes: 1 %
Lymphocytes Absolute: 3 x10E3/uL (ref 0.7–3.1)
Lymphs: 34 %
MCH: 15.1 pg — ABNORMAL LOW (ref 26.6–33.0)
MCHC: 25.2 g/dL — ABNORMAL LOW (ref 31.5–35.7)
MCV: 60 fL — ABNORMAL LOW (ref 79–97)
Monocytes Absolute: 0.9 x10E3/uL (ref 0.1–0.9)
Monocytes: 10 %
Neutrophils Absolute: 4.6 x10E3/uL (ref 1.4–7.0)
Neutrophils: 51 %
Platelets: 329 x10E3/uL (ref 150–450)
RBC: 4.78 x10E6/uL (ref 3.77–5.28)
RDW: 20.8 % — ABNORMAL HIGH (ref 11.7–15.4)
WBC: 8.8 x10E3/uL (ref 3.4–10.8)

## 2023-11-29 LAB — TSH+FREE T4
Free T4: 1.03 ng/dL (ref 0.82–1.77)
TSH: 1.02 u[IU]/mL (ref 0.450–4.500)

## 2023-11-29 LAB — MAGNESIUM: Magnesium: 1.8 mg/dL (ref 1.6–2.3)

## 2023-11-30 ENCOUNTER — Ambulatory Visit: Payer: Self-pay | Admitting: Family Medicine

## 2023-11-30 ENCOUNTER — Other Ambulatory Visit: Payer: Self-pay | Admitting: Family Medicine

## 2023-11-30 DIAGNOSIS — D500 Iron deficiency anemia secondary to blood loss (chronic): Secondary | ICD-10-CM

## 2023-12-01 LAB — SPECIMEN STATUS REPORT

## 2023-12-01 LAB — IRON,TIBC AND FERRITIN PANEL
Ferritin: 10 ng/mL — ABNORMAL LOW (ref 15–150)
Iron Saturation: 4 % — CL (ref 15–55)
Iron: 12 ug/dL — ABNORMAL LOW (ref 27–159)
Total Iron Binding Capacity: 341 ug/dL (ref 250–450)
UIBC: 329 ug/dL (ref 131–425)

## 2023-12-01 LAB — NUSWAB VAGINITIS PLUS (VG+)
Atopobium vaginae: HIGH {score} — AB
Candida albicans, NAA: NEGATIVE
Candida glabrata, NAA: NEGATIVE
Chlamydia trachomatis, NAA: NEGATIVE
Neisseria gonorrhoeae, NAA: NEGATIVE
Trich vag by NAA: NEGATIVE

## 2023-12-04 ENCOUNTER — Other Ambulatory Visit: Payer: Self-pay | Admitting: Family Medicine

## 2023-12-04 DIAGNOSIS — E611 Iron deficiency: Secondary | ICD-10-CM

## 2023-12-05 ENCOUNTER — Other Ambulatory Visit: Payer: Self-pay | Admitting: Family Medicine

## 2023-12-05 DIAGNOSIS — R300 Dysuria: Secondary | ICD-10-CM

## 2023-12-14 ENCOUNTER — Ambulatory Visit: Admitting: Oncology

## 2023-12-14 ENCOUNTER — Ambulatory Visit

## 2023-12-14 ENCOUNTER — Inpatient Hospital Stay: Attending: Oncology | Admitting: Oncology

## 2023-12-14 ENCOUNTER — Inpatient Hospital Stay

## 2023-12-14 ENCOUNTER — Encounter: Payer: Self-pay | Admitting: Oncology

## 2023-12-14 VITALS — BP 128/78 | HR 89

## 2023-12-14 VITALS — BP 138/77 | HR 94 | Temp 97.8°F | Resp 18 | Ht 65.0 in | Wt 346.0 lb

## 2023-12-14 DIAGNOSIS — D500 Iron deficiency anemia secondary to blood loss (chronic): Secondary | ICD-10-CM | POA: Insufficient documentation

## 2023-12-14 DIAGNOSIS — D563 Thalassemia minor: Secondary | ICD-10-CM | POA: Insufficient documentation

## 2023-12-14 MED ORDER — IRON SUCROSE 20 MG/ML IV SOLN
200.0000 mg | Freq: Once | INTRAVENOUS | Status: AC
  Administered 2023-12-14: 200 mg via INTRAVENOUS
  Filled 2023-12-14: qty 10

## 2023-12-14 NOTE — Progress Notes (Signed)
 Patient would like to discuss with the doctor today about her diagnosis and what she needs to be doing.

## 2023-12-14 NOTE — Patient Instructions (Signed)

## 2023-12-14 NOTE — Assessment & Plan Note (Addendum)
 Lab Results  Component Value Date   HGB 7.2 (L) 11/28/2023   TIBC 341 11/28/2023   IRONPCTSAT 4 (LL) 11/28/2023   FERRITIN 10 (L) 11/28/2023    Consistent with iron  deficient anemia.  Patient does not tolerate oral iron  supplementation. I discussed about  IV Venofer  treatments. I discussed about the potential risks including but not limited to allergic reactions/infusion reactions including anaphylactic reactions, diarrhea, phlebitis, high blood pressure, wheezing, SOB, skin rash, weight gain,dark urine, leg swelling, back pain, headache, nausea and fatigue, etc. Patient tolerates oral iron  supplement poorly and desires to achieved higher level of iron  faster for adequate hematopoesis. Plan IV venofer  weekly x 4 Patient denies chance of pregnancy.  Patient has tubes tied.

## 2023-12-14 NOTE — Progress Notes (Signed)
 Hematology/Oncology Progress note [phone] [phone]            Patient Care Team: [name], Devere POUR, MD as PCP - General (Family Medicine) Babara Call, MD as Consulting Physician (Hematology)  REFERRING PROVIDER: [name], Devere POUR, MD  CHIEF COMPLAINTS/REASON FOR VISIT:  Iron  deficiency anemia  ASSESSMENT & PLAN:   Iron  deficiency anemia due to chronic blood loss Lab Results  Component Value Date   HGB 7.2 (L) 11/28/2023   TIBC 341 11/28/2023   IRONPCTSAT 4 (LL) 11/28/2023   FERRITIN 10 (L) 11/28/2023    Consistent with iron  deficient anemia.  Patient does not tolerate oral iron  supplementation. I discussed about  IV Venofer  treatments. I discussed about the potential risks including but not limited to allergic reactions/infusion reactions including anaphylactic reactions, diarrhea, phlebitis, high blood pressure, wheezing, SOB, skin rash, weight gain,dark urine, leg swelling, back pain, headache, nausea and fatigue, etc. Patient tolerates oral iron  supplement poorly and desires to achieved higher level of iron  faster for adequate hematopoesis. Plan IV venofer  weekly x 4 Patient denies chance of pregnancy.  Patient has tubes tied.  Alpha thalassemia minor Results were discussed with patient previously no intervention needed. Recommend prenatal genetic counseling  Orders Placed This Encounter  Procedures   CBC with Differential/Platelet    Standing Status:   Future    Expected Date:   03/15/2024    Expiration Date:   06/13/2024   Ferritin    Standing Status:   Future    Expected Date:   03/15/2024    Expiration Date:   06/13/2024   Iron  and TIBC    Standing Status:   Future    Expected Date:   03/15/2024    Expiration Date:   06/13/2024   Retic Panel    Standing Status:   Future    Expected Date:   03/15/2024    Expiration Date:   06/13/2024   Follow-up in 3 months. All questions were answered. The patient knows to call the clinic with any  problems, questions or concerns.  Call Babara, MD, PhD [facility] Hematology Oncology 12/14/2023    HISTORY OF PRESENTING ILLNESS:   Charlene Gregory is a  38 y.o.  female with PMH listed below was seen in consultation at the request of  [name], [name], MD  for evaluation of microcytic anemia.   05/30/2021 cbc showed hemoglobin of 11.1, mcv 64,  Microcytosis is chronic. Patient has a history of iron  deficiency anemia and was previously seen by [facility] hematology. she has previously used ferrous sulfate over the years at a dose of 325 mg daily but it causes such severe constipation She received IV iron  with INFED at [facility] and tolerated well.  Menstrual period is regular and usually heavy for the first 2 days and usually last 4 days. She has vaginal delivery on 12/29/2020 Denies black tarry stool or blood in stool.  + fatigue    INTERVAL HISTORY Charlene Gregory is a 38 y.o. female who has above history reviewed by me today presents for follow up visit for iron  deficiency anemia.  Patient was previously seen by few years ago.  She has alpha thalassemia trait.  History of iron  deficiency anemia, previously received INFeD at [facility] and tolerated well. 11/28/2023, CBC showed hemoglobin of 7.2, MCV 68, iron  panel showed iron  saturation of 4, ferritin 10.  Patient presents to reestablish care. Patient has tried oral iron  supplementation and not able to tolerate. She is on Zepbound  treatments.  She  noticed that menstrual bleeding has been heavier since giving birth to her child.  Usually lasted 5 days, with 1 to 2 days very heavy.  Menstrual period is regular.  Denies any blood in the stool, melena.  She has noticed heart skipping beats occasionally.   Review of Systems  Constitutional:  Negative for appetite change, chills, fatigue and fever.  HENT:   Negative for hearing loss and voice change.   Eyes:  Negative for eye problems.  Respiratory:  Negative for chest tightness and cough.   Cardiovascular:   Negative for chest pain.       Heart skipped beats occasionally  Gastrointestinal:  Negative for abdominal distention, abdominal pain and blood in stool.  Endocrine: Negative for hot flashes.  Genitourinary:  Negative for difficulty urinating and frequency.   Musculoskeletal:  Negative for arthralgias.  Skin:  Negative for itching and rash.  Neurological:  Negative for extremity weakness.  Hematological:  Negative for adenopathy.  Psychiatric/Behavioral:  Negative for confusion.     MEDICAL HISTORY:  Past Medical History:  Diagnosis Date   Acid reflux disease    Adnexal mass    Anemia    Environmental allergies    Headache    History of 2019 novel coronavirus disease (COVID-19) 05/13/2019   HSV-2 infection    Hypertension    takes amlodipine  5mg    Morbid obesity due to excess calories (HCC)     SURGICAL HISTORY: Past Surgical History:  Procedure Laterality Date   COLONOSCOPY W/ POLYPECTOMY     UPPER GI ENDOSCOPY      SOCIAL HISTORY: Social History   Socioeconomic History   Marital status: Single    Spouse name: Not on file   Number of children: Not on file   Years of education: Not on file   Highest education level: Not on file  Occupational History   Not on file  Tobacco Use   Smoking status: Never    Passive exposure: Never   Smokeless tobacco: Never  Vaping Use   Vaping status: Never Used  Substance and Sexual Activity   Alcohol use: Not Currently    Comment: occasion   Drug use: Never   Sexual activity: Yes  Other Topics Concern   Not on file  Social History Narrative   Not on file   Social Drivers of Health   Financial Resource Strain: Not on file  Food Insecurity: Not on file  Transportation Needs: Not on file  Physical Activity: Not on file  Stress: Not on file (03/01/2023)  Social Connections: Not on file  Intimate Partner Violence: Not At Risk (09/09/2023)   Received from [facility]   Humiliation, Afraid, Rape, and Kick questionnaire     Within the last year, have you been afraid of your partner or ex-partner?: No    Within the last year, have you been humiliated or emotionally abused in other ways by your partner or ex-partner?: No    Within the last year, have you been kicked, hit, slapped, or otherwise physically hurt by your partner or ex-partner?: No    Within the last year, have you been raped or forced to have any kind of sexual activity by your partner or ex-partner?: No    FAMILY HISTORY: Family History  Problem Relation Age of Onset   Healthy Mother    Cancer Father     ALLERGIES:  is allergic to pollen extract.  MEDICATIONS:  Current Outpatient Medications  Medication Sig Dispense Refill   amLODipine  (  NORVASC ) 10 MG tablet Take 1 tablet (10 mg total) by mouth daily. 90 tablet 1   cetirizine (ZYRTEC) 10 MG tablet Take 10 mg by mouth daily.     Cholecalciferol 1.25 MG (50000 UT) capsule Take 1 capsule by mouth once a week.     montelukast (SINGULAIR) 10 MG tablet Take 10 mg by mouth at bedtime.     pantoprazole  (PROTONIX ) 40 MG tablet Take 1 tablet (40 mg total) by mouth 2 (two) times daily. 60 tablet 3   SUMAtriptan  (IMITREX ) 25 MG tablet May repeat in 2 hours if headache persists or recurs.  No more than 2 doses in 24 hours. 10 tablet 1   tirzepatide  (ZEPBOUND ) 10 MG/0.5ML Pen Inject 10 mg into the skin once a week. 2 mL 0   No current facility-administered medications for this visit.     PHYSICAL EXAMINATION: ECOG PERFORMANCE STATUS: 0 - Asymptomatic Vitals:   12/14/23 1440  BP: 138/77  Pulse: 94  Resp: 18  Temp: 97.8 F (36.6 C)  SpO2: 100%   Filed Weights   12/14/23 1440  Weight: (!) 346 lb (156.9 kg)    Physical Exam Constitutional:      General: She is not in acute distress.    Appearance: She is obese.  HENT:     Head: Normocephalic and atraumatic.  Eyes:     General: No scleral icterus. Cardiovascular:     Rate and Rhythm: Normal rate and regular rhythm.     Heart sounds:  Murmur heard.  Pulmonary:     Effort: Pulmonary effort is normal. No respiratory distress.     Breath sounds: No wheezing.  Abdominal:     General: Bowel sounds are normal. There is no distension.     Palpations: Abdomen is soft.  Musculoskeletal:        General: No deformity. Normal range of motion.     Cervical back: Normal range of motion and neck supple.  Skin:    General: Skin is warm and dry.     Findings: No erythema or rash.  Neurological:     Mental Status: She is alert and oriented to person, place, and time. Mental status is at baseline.     Cranial Nerves: No cranial nerve deficit.     Coordination: Coordination normal.  Psychiatric:        Mood and Affect: Mood normal.     LABORATORY DATA:  I have reviewed the data as listed Lab Results  Component Value Date   WBC 8.8 11/28/2023   HGB 7.2 (L) 11/28/2023   HCT 28.6 (L) 11/28/2023   MCV 60 (L) 11/28/2023   PLT 329 11/28/2023   Recent Labs    11/08/23 1337  NA 137  K 4.2  CL 103  CO2 16*  GLUCOSE 81  BUN 11  CREATININE 0.60  CALCIUM 8.9  PROT 7.3  ALBUMIN 3.8*  AST 12  ALT 8  ALKPHOS 86  BILITOT 0.2   Iron /TIBC/Ferritin/ %Sat    Component Value Date/Time   IRON  12 (L) 11/28/2023 1003   TIBC 341 11/28/2023 1003   FERRITIN 10 (L) 11/28/2023 1003   IRONPCTSAT 4 (LL) 11/28/2023 1003      RADIOGRAPHIC STUDIES: I have personally reviewed the radiological images as listed and agreed with the findings in the report. No results found.

## 2023-12-14 NOTE — Assessment & Plan Note (Signed)
 Results were discussed with patient previously no intervention needed. Recommend prenatal genetic counseling

## 2023-12-15 DIAGNOSIS — K219 Gastro-esophageal reflux disease without esophagitis: Secondary | ICD-10-CM | POA: Diagnosis not present

## 2023-12-15 DIAGNOSIS — K5900 Constipation, unspecified: Secondary | ICD-10-CM | POA: Diagnosis not present

## 2023-12-19 DIAGNOSIS — G43009 Migraine without aura, not intractable, without status migrainosus: Secondary | ICD-10-CM | POA: Diagnosis not present

## 2023-12-19 DIAGNOSIS — R519 Headache, unspecified: Secondary | ICD-10-CM | POA: Diagnosis not present

## 2023-12-19 DIAGNOSIS — Z1330 Encounter for screening examination for mental health and behavioral disorders, unspecified: Secondary | ICD-10-CM | POA: Diagnosis not present

## 2023-12-19 DIAGNOSIS — J45909 Unspecified asthma, uncomplicated: Secondary | ICD-10-CM | POA: Diagnosis not present

## 2023-12-20 ENCOUNTER — Encounter: Payer: Self-pay | Admitting: Family Medicine

## 2023-12-21 ENCOUNTER — Inpatient Hospital Stay

## 2023-12-21 VITALS — BP 157/80 | HR 88 | Resp 18

## 2023-12-21 DIAGNOSIS — D500 Iron deficiency anemia secondary to blood loss (chronic): Secondary | ICD-10-CM

## 2023-12-21 DIAGNOSIS — D563 Thalassemia minor: Secondary | ICD-10-CM | POA: Diagnosis not present

## 2023-12-21 MED ORDER — IRON SUCROSE 20 MG/ML IV SOLN
200.0000 mg | Freq: Once | INTRAVENOUS | Status: AC
  Administered 2023-12-21: 200 mg via INTRAVENOUS
  Filled 2023-12-21: qty 10

## 2023-12-22 ENCOUNTER — Other Ambulatory Visit: Payer: Self-pay | Admitting: Family Medicine

## 2023-12-22 MED ORDER — ZEPBOUND 12.5 MG/0.5ML ~~LOC~~ SOAJ: 12.5000 mg | SUBCUTANEOUS | 0 refills | Status: DC

## 2023-12-28 ENCOUNTER — Inpatient Hospital Stay: Attending: Oncology

## 2023-12-28 VITALS — BP 127/91 | HR 95 | Temp 97.3°F | Resp 18

## 2023-12-28 DIAGNOSIS — D500 Iron deficiency anemia secondary to blood loss (chronic): Secondary | ICD-10-CM | POA: Insufficient documentation

## 2023-12-28 DIAGNOSIS — Z79899 Other long term (current) drug therapy: Secondary | ICD-10-CM | POA: Diagnosis not present

## 2023-12-28 MED ORDER — IRON SUCROSE 20 MG/ML IV SOLN
200.0000 mg | Freq: Once | INTRAVENOUS | Status: AC
  Administered 2023-12-28: 200 mg via INTRAVENOUS
  Filled 2023-12-28: qty 10

## 2023-12-28 NOTE — Patient Instructions (Signed)

## 2023-12-29 ENCOUNTER — Inpatient Hospital Stay

## 2024-01-04 ENCOUNTER — Inpatient Hospital Stay

## 2024-01-04 VITALS — BP 112/82 | HR 81 | Temp 96.9°F | Resp 18

## 2024-01-04 DIAGNOSIS — Z79899 Other long term (current) drug therapy: Secondary | ICD-10-CM | POA: Diagnosis not present

## 2024-01-04 DIAGNOSIS — D500 Iron deficiency anemia secondary to blood loss (chronic): Secondary | ICD-10-CM

## 2024-01-04 MED ORDER — SODIUM CHLORIDE 0.9% FLUSH
10.0000 mL | Freq: Once | INTRAVENOUS | Status: AC | PRN
  Administered 2024-01-04: 10 mL
  Filled 2024-01-04: qty 10

## 2024-01-04 MED ORDER — IRON SUCROSE 20 MG/ML IV SOLN
200.0000 mg | Freq: Once | INTRAVENOUS | Status: AC
  Administered 2024-01-04: 200 mg via INTRAVENOUS
  Filled 2024-01-04: qty 10

## 2024-01-11 ENCOUNTER — Inpatient Hospital Stay

## 2024-01-11 VITALS — BP 118/79 | HR 79 | Temp 98.2°F | Resp 16

## 2024-01-11 DIAGNOSIS — Z79899 Other long term (current) drug therapy: Secondary | ICD-10-CM | POA: Diagnosis not present

## 2024-01-11 DIAGNOSIS — D500 Iron deficiency anemia secondary to blood loss (chronic): Secondary | ICD-10-CM | POA: Diagnosis not present

## 2024-01-11 MED ORDER — IRON SUCROSE 20 MG/ML IV SOLN
200.0000 mg | Freq: Once | INTRAVENOUS | Status: AC
  Administered 2024-01-11: 200 mg via INTRAVENOUS
  Filled 2024-01-11: qty 10

## 2024-01-11 NOTE — Patient Instructions (Signed)

## 2024-01-22 ENCOUNTER — Ambulatory Visit: Payer: Self-pay

## 2024-01-22 NOTE — Telephone Encounter (Signed)
 FYI Only or Action Required?: Action required by provider: clinical question for provider, update on patient condition, and lab or test result follow-up needed.  Patient was last seen in primary care on 11/28/2023 by [name], [name], MD.  Called Nurse Triage reporting Abdominal Pain and Cough.  Symptoms began a week ago.  Interventions attempted: Rest, hydration, or home remedies.  Symptoms are: gradually worsening.  Triage Disposition: See Physician Within 24 Hours  Patient/caregiver understands and will follow disposition?: Yes  Copied from CRM #[id number]. Topic: Clinical - Red Word Triage >> Jan 22, 2024  8:42 AM Antony RAMAN wrote: Red Word that prompted transfer to Nurse Triage: stomach pain, pt stated maybe uti or bacteria infection >> Jan 22, 2024  8:56 AM Antony RAMAN wrote: Patient Charlene Gregory MRN: [id number] stated she had abdominal pain and suspects a UTI or bacterial infection. I stated she needed to talk to a nurse since she has pain, she wanted me to scheudle instead. Eventually I got her to agree to speak to a nurse but after 10 ish mins of waiting pt just asked to be scheduled instead of waiting longer. I scheduled the appt for tomorrow but I told the pt I would have a nurse call her back today to still speak with her about the pain. Please call pt back at [phone]  Reason for Disposition  [1] MILD pain (e.g., does not interfere with normal activities) AND [2] pain comes and goes (cramps) AND [3] present > 48 hours  (Exception: This same abdominal pain is a chronic symptom recurrent or ongoing AND present > 4 weeks.)  Answer Assessment - Initial Assessment Questions 1. LOCATION: Where does it hurt?      General  2. RADIATION: Does the pain shoot anywhere else? (e.g., chest, back)     Denies   3. ONSET: When did the pain begin? (e.g., minutes, hours or days ago)      2 Weeks  4. SUDDEN: Gradual or sudden onset?     Gradual  5. PATTERN Does the pain come  and go, or is it constant?     Intermittent  6. SEVERITY: How bad is the pain?  (e.g., Scale 1-10; mild, moderate, or severe)     Mild  7. RECURRENT SYMPTOM: Have you ever had this type of stomach pain before? If Yes, ask: When was the last time? and What happened that time?      No  8. CAUSE: What do you think is causing the stomach pain? (e.g., gallstones, recent abdominal surgery)     Denies  9. RELIEVING/AGGRAVATING FACTORS: What makes it better or worse? (e.g., antacids, bending or twisting motion, bowel movement)     Denies both  10. OTHER SYMPTOMS: Do you have any other symptoms? (e.g., back pain, diarrhea, fever, urination pain, vomiting)       Dry Cough, Urinary Frequency  11. PREGNANCY: Is there any chance you are pregnant? When was your last menstrual period?       No and LMP 7th of September  Protocols used: Abdominal Pain - Female-A-AH

## 2024-01-23 ENCOUNTER — Encounter: Payer: Self-pay | Admitting: Family Medicine

## 2024-01-23 ENCOUNTER — Ambulatory Visit (INDEPENDENT_AMBULATORY_CARE_PROVIDER_SITE_OTHER): Admitting: Family Medicine

## 2024-01-23 VITALS — BP 111/83 | HR 89 | Temp 98.1°F | Resp 18 | Ht 65.0 in | Wt 331.0 lb

## 2024-01-23 DIAGNOSIS — Z113 Encounter for screening for infections with a predominantly sexual mode of transmission: Secondary | ICD-10-CM | POA: Diagnosis not present

## 2024-01-23 DIAGNOSIS — Z23 Encounter for immunization: Secondary | ICD-10-CM | POA: Diagnosis not present

## 2024-01-23 DIAGNOSIS — R300 Dysuria: Secondary | ICD-10-CM | POA: Diagnosis not present

## 2024-01-23 DIAGNOSIS — R82998 Other abnormal findings in urine: Secondary | ICD-10-CM | POA: Diagnosis not present

## 2024-01-23 DIAGNOSIS — R319 Hematuria, unspecified: Secondary | ICD-10-CM | POA: Diagnosis not present

## 2024-01-23 LAB — POCT URINALYSIS DIP (CLINITEK)
Bilirubin, UA: NEGATIVE
Glucose, UA: NEGATIVE mg/dL
Ketones, POC UA: NEGATIVE mg/dL
Nitrite, UA: NEGATIVE
Spec Grav, UA: 1.02 (ref 1.010–1.025)
Urobilinogen, UA: 0.2 U/dL
pH, UA: 6 (ref 5.0–8.0)

## 2024-01-23 NOTE — Assessment & Plan Note (Signed)
 Asymptomatic.  Just wants to be checked.

## 2024-01-23 NOTE — Progress Notes (Signed)
 uri

## 2024-01-23 NOTE — Progress Notes (Signed)
 Established Patient Office Visit  Subjective   Patient ID: Charlene Gregory, female    DOB: 1985/08/13  Age: 38 y.o. MRN: [id number]  Chief Complaint  Patient presents with   Urinary Tract Infection   Cough    Non productive     Urinary Tract Infection   Cough   Discussed the use of AI scribe software for clinical note transcription with the patient, who gave verbal consent to proceed.  History of Present Illness   Charlene Gregory is a 38 year old female who presents with symptoms suggestive of a bladder infection.  She experiences intermittent left-sided abdominal pain, which she associates with her menstrual cycle. The pain is described as coming and going, and her menstrual cycle is starting earlier than usual. She also reports hematuria, attributing it to her menstrual cycle.  No fever. She mentions an increase in water intake recently. She typically avoids taking medication for pain unless it becomes severe, at which point she may take Aleve.  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 She has a background in criminal justice but is not currently using her degree in that field.  She requests a flu shot and a yearly STD screening, including tests for HIV, hepatitis C, and syphilis. She started getting flu shots regularly after becoming pregnant and also receives COVID-19 vaccinations.        Objective:     BP 111/83 (BP Location: Left Arm, Patient Position: Sitting, Cuff Size: Large)   Pulse 89   Temp 98.1 F (36.7 C) (Oral)   Resp 18   Ht 5' 5 (1.651 m)   Wt (!) 331 lb (150.1 kg)   LMP 12/25/2023   SpO2 98%   BMI 55.08 kg/m    Physical Exam Vitals and nursing note reviewed.  Constitutional:      Appearance: Normal appearance.  HENT:     Head: Normocephalic and atraumatic.  Eyes:     Conjunctiva/sclera: Conjunctivae normal.  Cardiovascular:     Rate and Rhythm: Normal  rate and regular rhythm.  Pulmonary:     Effort: Pulmonary effort is normal.     Breath sounds: Normal breath sounds.  Abdominal:     General: Abdomen is flat. Bowel sounds are normal.     Palpations: Abdomen is soft.     Tenderness: There is no abdominal tenderness.  Musculoskeletal:     Right lower leg: No edema.     Left lower leg: No edema.  Skin:    General: Skin is warm and dry.  Neurological:     Mental Status: She is alert and oriented to person, place, and time.  Psychiatric:        Mood and Affect: Mood normal.        Behavior: Behavior normal.        Thought Content: Thought content normal.        Judgment: Judgment normal.          Results for orders placed or performed in visit on 01/23/24  POCT URINALYSIS DIP (CLINITEK)  Result Value Ref Range   Color, UA yellow yellow   Clarity, UA clear clear   Glucose, UA negative negative mg/dL   Bilirubin, UA negative negative   Ketones, POC UA negative negative mg/dL   Spec Grav, UA 8.979 8.989 - 1.025   Blood, UA large (A) negative   pH, UA 6.0 5.0 - 8.0   POC  PROTEIN,UA trace negative, trace   Urobilinogen, UA 0.2 0.2 or 1.0 E.U./dL   Nitrite, UA Negative Negative   Leukocytes, UA Trace (A) Negative      The ASCVD Risk score (Arnett DK, et al., 2019) failed to calculate for the following reasons:   The 2019 ASCVD risk score is only valid for ages 16 to 26    Assessment & Plan:  Dysuria -     POCT URINALYSIS DIP (CLINITEK)  Immunization due -     Flu vaccine trivalent PF, 6mos and older(Flulaval,Afluria,Fluarix,Fluzone)  Screening examination for STI -     NuSwab Vaginitis Plus (VG+) -     RPR -     Hepatitis C RNA quantitative -     HIV Antibody (routine testing w rflx)  Leukocytes in urine -     Urine Culture  Hematuria detected by chemical testing Assessment & Plan: She has microscopic hematuria which she attributes to her menstrual cycle.  Trace leukocytes and specific gravity 1.020.  Will  culture for reassurance   Screening for STD (sexually transmitted disease) Assessment & Plan: Asymptomatic.  Just wants to be checked.        No follow-ups on file.    Lyndon [place] K Shanika Levings, MD

## 2024-01-23 NOTE — Assessment & Plan Note (Signed)
 She has microscopic hematuria which she attributes to her menstrual cycle.  Trace leukocytes and specific gravity 1.020.  Will culture for reassurance

## 2024-01-24 LAB — HIV ANTIBODY (ROUTINE TESTING W REFLEX): HIV Screen 4th Generation wRfx: NONREACTIVE

## 2024-01-24 LAB — RPR: RPR Ser Ql: NONREACTIVE

## 2024-01-25 ENCOUNTER — Ambulatory Visit: Payer: Self-pay | Admitting: Family Medicine

## 2024-01-25 LAB — URINE CULTURE

## 2024-01-25 LAB — NUSWAB VAGINITIS PLUS (VG+)
Candida albicans, NAA: NEGATIVE
Candida glabrata, NAA: NEGATIVE
Chlamydia trachomatis, NAA: NEGATIVE
Neisseria gonorrhoeae, NAA: NEGATIVE
Trich vag by NAA: NEGATIVE

## 2024-02-12 ENCOUNTER — Ambulatory Visit: Admitting: Neurology

## 2024-03-25 DIAGNOSIS — G43009 Migraine without aura, not intractable, without status migrainosus: Secondary | ICD-10-CM | POA: Diagnosis not present

## 2024-03-25 DIAGNOSIS — R519 Headache, unspecified: Secondary | ICD-10-CM | POA: Diagnosis not present

## 2024-03-25 DIAGNOSIS — M5481 Occipital neuralgia: Secondary | ICD-10-CM | POA: Diagnosis not present

## 2024-03-26 ENCOUNTER — Inpatient Hospital Stay: Attending: Oncology

## 2024-03-26 DIAGNOSIS — D563 Thalassemia minor: Secondary | ICD-10-CM | POA: Diagnosis not present

## 2024-03-26 DIAGNOSIS — N920 Excessive and frequent menstruation with regular cycle: Secondary | ICD-10-CM | POA: Insufficient documentation

## 2024-03-26 DIAGNOSIS — D500 Iron deficiency anemia secondary to blood loss (chronic): Secondary | ICD-10-CM | POA: Diagnosis present

## 2024-03-26 LAB — CBC WITH DIFFERENTIAL/PLATELET
Abs Immature Granulocytes: 0.03 K/uL (ref 0.00–0.07)
Basophils Absolute: 0.1 K/uL (ref 0.0–0.1)
Basophils Relative: 1 %
Eosinophils Absolute: 0.4 K/uL (ref 0.0–0.5)
Eosinophils Relative: 4 %
HCT: 32.6 % — ABNORMAL LOW (ref 36.0–46.0)
Hemoglobin: 10.3 g/dL — ABNORMAL LOW (ref 12.0–15.0)
Immature Granulocytes: 0 %
Lymphocytes Relative: 41 %
Lymphs Abs: 3.6 K/uL (ref 0.7–4.0)
MCH: 19.2 pg — ABNORMAL LOW (ref 26.0–34.0)
MCHC: 31.6 g/dL (ref 30.0–36.0)
MCV: 60.7 fL — ABNORMAL LOW (ref 80.0–100.0)
Monocytes Absolute: 0.9 K/uL (ref 0.1–1.0)
Monocytes Relative: 11 %
Neutro Abs: 3.8 K/uL (ref 1.7–7.7)
Neutrophils Relative %: 43 %
Platelets: 245 K/uL (ref 150–400)
RBC: 5.37 MIL/uL — ABNORMAL HIGH (ref 3.87–5.11)
RDW: 19.2 % — ABNORMAL HIGH (ref 11.5–15.5)
WBC: 8.8 K/uL (ref 4.0–10.5)
nRBC: 0 % (ref 0.0–0.2)

## 2024-03-26 LAB — FERRITIN: Ferritin: 36 ng/mL (ref 11–307)

## 2024-03-26 LAB — IRON AND TIBC
Iron: 20 ug/dL — ABNORMAL LOW (ref 28–170)
Saturation Ratios: 6 % — ABNORMAL LOW (ref 10.4–31.8)
TIBC: 329 ug/dL (ref 250–450)
UIBC: 309 ug/dL

## 2024-03-26 LAB — RETIC PANEL
Immature Retic Fract: 22.5 % — ABNORMAL HIGH (ref 2.3–15.9)
RBC.: 5.37 MIL/uL — ABNORMAL HIGH (ref 3.87–5.11)
Retic Count, Absolute: 77.9 K/uL (ref 19.0–186.0)
Retic Ct Pct: 1.5 % (ref 0.4–3.1)
Reticulocyte Hemoglobin: 19.7 pg — ABNORMAL LOW (ref >27.9)

## 2024-03-28 ENCOUNTER — Inpatient Hospital Stay: Admitting: Oncology

## 2024-03-28 ENCOUNTER — Encounter: Payer: Self-pay | Admitting: Oncology

## 2024-03-28 ENCOUNTER — Inpatient Hospital Stay

## 2024-03-28 VITALS — BP 133/91 | HR 91 | Temp 96.2°F | Resp 18 | Wt 345.6 lb

## 2024-03-28 VITALS — BP 124/94 | HR 83

## 2024-03-28 DIAGNOSIS — D500 Iron deficiency anemia secondary to blood loss (chronic): Secondary | ICD-10-CM | POA: Diagnosis not present

## 2024-03-28 DIAGNOSIS — D563 Thalassemia minor: Secondary | ICD-10-CM

## 2024-03-28 DIAGNOSIS — N920 Excessive and frequent menstruation with regular cycle: Secondary | ICD-10-CM | POA: Diagnosis not present

## 2024-03-28 MED ORDER — IRON SUCROSE 20 MG/ML IV SOLN
200.0000 mg | Freq: Once | INTRAVENOUS | Status: AC
  Administered 2024-03-28: 200 mg via INTRAVENOUS
  Filled 2024-03-28: qty 10

## 2024-03-28 NOTE — Assessment & Plan Note (Signed)
 Results were discussed with patient previously no intervention needed. Recommend prenatal genetic counseling

## 2024-03-28 NOTE — Progress Notes (Signed)
 Hematology/Oncology Progress note [phone] [phone]            Patient Care Team: [name], Devere POUR, MD as PCP - General (Family Medicine) Babara Call, MD as Consulting Physician (Hematology)  REFERRING PROVIDER: Onita Devere POUR, MD  CHIEF COMPLAINTS/REASON FOR VISIT:  Iron  deficiency anemia  ASSESSMENT & PLAN:   Iron  deficiency anemia due to chronic blood loss Lab Results  Component Value Date   HGB 10.3 (L) 03/26/2024   TIBC 329 03/26/2024   IRONPCTSAT 6 (L) 03/26/2024   FERRITIN 36 03/26/2024    Improved hemoglobin and iron , still has iron  deficient anemia. Chronic blood loss due to heavy menses Recommend weekly Venofer  x 3  Alpha thalassemia minor Results were discussed with patient previously no intervention needed. Recommend prenatal genetic counseling  Menorrhagia Follow up with GYn  Orders Placed This Encounter  Procedures   CBC with Differential (Cancer Center Only)    Standing Status:   Future    Expected Date:   07/27/2024    Expiration Date:   10/25/2024   Iron  and TIBC    Standing Status:   Future    Expected Date:   07/27/2024    Expiration Date:   10/25/2024   Ferritin    Standing Status:   Future    Expected Date:   07/27/2024    Expiration Date:   10/25/2024   Follow-up in 4 months. All questions were answered. The patient knows to call the clinic with any problems, questions or concerns.  Call Babara, MD, PhD [facility] Hematology Oncology 03/28/2024    HISTORY OF PRESENTING ILLNESS:   Charlene Gregory is a  38 y.o.  female with PMH listed below was seen in consultation at the request of  [name], [name], MD  for evaluation of microcytic anemia.   05/30/2021 cbc showed hemoglobin of 11.1, mcv 64,  Microcytosis is chronic. Patient has a history of iron  deficiency anemia and was previously seen by [facility] hematology. she has previously used ferrous sulfate over the years at a dose of 325 mg daily but it causes such severe  constipation She received IV iron  with INFED at [facility] and tolerated well.  Menstrual period is regular and usually heavy for the first 2 days and usually last 4 days. She has vaginal delivery on 12/29/2020 Denies black tarry stool or blood in stool.  + fatigue    INTERVAL HISTORY Charlene Gregory is a 38 y.o. female who has above history reviewed by me today presents for follow up visit for iron  deficiency anemia. She tolerated IV venofer  menstrual bleeding has been heavier since giving birth to her child.  Usually lasted 5 days, with 1 to 2 days very heavy.  Menstrual period is regular.  Denies any blood in the stool, melena.   Still has some fatigue   Review of Systems  Constitutional:  Positive for fatigue. Negative for appetite change, chills and fever.  HENT:   Negative for hearing loss and voice change.   Eyes:  Negative for eye problems.  Respiratory:  Negative for chest tightness and cough.   Cardiovascular:  Negative for chest pain.       Heart skipped beats occasionally  Gastrointestinal:  Negative for abdominal distention, abdominal pain and blood in stool.  Endocrine: Negative for hot flashes.  Genitourinary:  Negative for difficulty urinating and frequency.   Musculoskeletal:  Negative for arthralgias.  Skin:  Negative for itching and rash.  Neurological:  Negative for extremity weakness.  Hematological:  Negative for adenopathy.  Psychiatric/Behavioral:  Negative for confusion.     MEDICAL HISTORY:  Past Medical History:  Diagnosis Date   Acid reflux disease    Adnexal mass    Anemia    Environmental allergies    Headache    History of 2019 novel coronavirus disease (COVID-19) 05/13/2019   HSV-2 infection    Hypertension    takes amlodipine  5mg    Morbid obesity due to excess calories (HCC)     SURGICAL HISTORY: Past Surgical History:  Procedure Laterality Date   COLONOSCOPY W/ POLYPECTOMY     UPPER GI ENDOSCOPY      SOCIAL HISTORY: Social History    Socioeconomic History   Marital status: Single    Spouse name: Not on file   Number of children: Not on file   Years of education: Not on file   Highest education level: Not on file  Occupational History   Not on file  Tobacco Use   Smoking status: Never    Passive exposure: Never   Smokeless tobacco: Never  Vaping Use   Vaping status: Never Used  Substance and Sexual Activity   Alcohol use: Not Currently    Comment: occasion   Drug use: Never   Sexual activity: Yes  Other Topics Concern   Not on file  Social History Narrative   Not on file   Social Drivers of Health   Financial Resource Strain: Low Risk  (12/19/2023)   Received from [facility]   Overall Financial Resource Strain (CARDIA)    How hard is it for you to pay for the very basics like food, housing, medical care, and heating?: Not hard at all  Food Insecurity: No Food Insecurity (12/19/2023)   Received from [facility]   Hunger Vital Sign    Within the past 12 months, you worried that your food would run out before you got the money to buy more.: Never true    Within the past 12 months, the food you bought just didn't last and you didn't have money to get more.: Never true  Transportation Needs: No Transportation Needs (12/19/2023)   Received from [facility] - Transportation    In the past 12 months, has lack of transportation kept you from medical appointments or from getting medications?: No    In the past 12 months, has lack of transportation kept you from meetings, work, or from getting things needed for daily living?: No  Physical Activity: Not on file  Stress: Not on file (03/01/2023)  Social Connections: Not on file  Intimate Partner Violence: Not At Risk (09/09/2023)   Received from [facility]   Humiliation, Afraid, Rape, and Kick questionnaire    Within the last year, have you been afraid of your partner or ex-partner?: No    Within the last year, have you been humiliated or  emotionally abused in other ways by your partner or ex-partner?: No    Within the last year, have you been kicked, hit, slapped, or otherwise physically hurt by your partner or ex-partner?: No    Within the last year, have you been raped or forced to have any kind of sexual activity by your partner or ex-partner?: No    FAMILY HISTORY: Family History  Problem Relation Age of Onset   Healthy Mother    Cancer Father     ALLERGIES:  is allergic to pollen extract.  MEDICATIONS:  Current Outpatient Medications  Medication Sig Dispense  Refill   amitriptyline (ELAVIL) 10 MG tablet Take 20 mg by mouth.     amLODipine  (NORVASC ) 10 MG tablet Take 1 tablet (10 mg total) by mouth daily. 90 tablet 1   cetirizine (ZYRTEC) 10 MG tablet Take 10 mg by mouth daily.     Cholecalciferol 1.25 MG (50000 UT) capsule Take 1 capsule by mouth once a week.     dexlansoprazole  (DEXILANT ) 60 MG capsule Take 60 mg by mouth.     montelukast (SINGULAIR) 10 MG tablet Take 10 mg by mouth at bedtime.     pantoprazole  (PROTONIX ) 40 MG tablet Take 1 tablet (40 mg total) by mouth 2 (two) times daily. 60 tablet 3   SUMAtriptan  (IMITREX ) 100 MG tablet Take 100 mg by mouth daily as needed.     tirzepatide  (ZEPBOUND ) 12.5 MG/0.5ML Pen Inject 12.5 mg into the skin once a week. 2 mL 0   amitriptyline (ELAVIL) 10 MG tablet Take 10 mg by mouth at bedtime. (Patient not taking: Reported on 03/28/2024)     SUMAtriptan  (IMITREX ) 25 MG tablet May repeat in 2 hours if headache persists or recurs.  No more than 2 doses in 24 hours. (Patient not taking: Reported on 03/28/2024) 10 tablet 1   No current facility-administered medications for this visit.     PHYSICAL EXAMINATION: ECOG PERFORMANCE STATUS: 0 - Asymptomatic Vitals:   03/28/24 1500 03/28/24 1510  BP: (!) 137/91 (!) 133/91  Pulse: 91   Resp: 18   Temp: (!) 96.2 F (35.7 C)   SpO2: 100%    Filed Weights   03/28/24 1500  Weight: (!) 345 lb 9.6 oz (156.8 kg)     Physical Exam Constitutional:      General: She is not in acute distress.    Appearance: She is obese.  HENT:     Head: Normocephalic and atraumatic.  Eyes:     General: No scleral icterus. Cardiovascular:     Rate and Rhythm: Normal rate and regular rhythm.     Heart sounds: Murmur heard.  Pulmonary:     Effort: Pulmonary effort is normal. No respiratory distress.     Breath sounds: No wheezing.  Abdominal:     General: Bowel sounds are normal. There is no distension.     Palpations: Abdomen is soft.  Musculoskeletal:        General: No deformity. Normal range of motion.     Cervical back: Normal range of motion and neck supple.  Skin:    General: Skin is warm and dry.     Findings: No erythema or rash.  Neurological:     Mental Status: She is alert and oriented to person, place, and time. Mental status is at baseline.     Cranial Nerves: No cranial nerve deficit.     Coordination: Coordination normal.  Psychiatric:        Mood and Affect: Mood normal.     LABORATORY DATA:  I have reviewed the data as listed Lab Results  Component Value Date   WBC 8.8 03/26/2024   HGB 10.3 (L) 03/26/2024   HCT 32.6 (L) 03/26/2024   MCV 60.7 (L) 03/26/2024   PLT 245 03/26/2024   Recent Labs    11/08/23 1337  NA 137  K 4.2  CL 103  CO2 16*  GLUCOSE 81  BUN 11  CREATININE 0.60  CALCIUM 8.9  PROT 7.3  ALBUMIN 3.8*  AST 12  ALT 8  ALKPHOS 86  BILITOT 0.2   Iron /TIBC/Ferritin/ %  Sat    Component Value Date/Time   IRON  20 (L) 03/26/2024 1453   IRON  12 (L) 11/28/2023 1003   TIBC 329 03/26/2024 1453   TIBC 341 11/28/2023 1003   FERRITIN 36 03/26/2024 1453   FERRITIN 10 (L) 11/28/2023 1003   IRONPCTSAT 6 (L) 03/26/2024 1453   IRONPCTSAT 4 (LL) 11/28/2023 1003      RADIOGRAPHIC STUDIES: I have personally reviewed the radiological images as listed and agreed with the findings in the report. No results found.

## 2024-03-28 NOTE — Assessment & Plan Note (Signed)
 Follow up with GYn

## 2024-03-28 NOTE — Patient Instructions (Signed)

## 2024-03-28 NOTE — Assessment & Plan Note (Addendum)
 Lab Results  Component Value Date   HGB 10.3 (L) 03/26/2024   TIBC 329 03/26/2024   IRONPCTSAT 6 (L) 03/26/2024   FERRITIN 36 03/26/2024    Improved hemoglobin and iron , still has iron  deficient anemia. Chronic blood loss due to heavy menses Recommend weekly Venofer  x 3

## 2024-04-03 ENCOUNTER — Encounter: Payer: Self-pay | Admitting: Family Medicine

## 2024-04-04 ENCOUNTER — Other Ambulatory Visit: Payer: Self-pay

## 2024-04-04 ENCOUNTER — Inpatient Hospital Stay

## 2024-04-04 VITALS — BP 127/91 | HR 82 | Temp 97.2°F | Resp 18

## 2024-04-04 DIAGNOSIS — D500 Iron deficiency anemia secondary to blood loss (chronic): Secondary | ICD-10-CM

## 2024-04-04 MED ORDER — IRON SUCROSE 20 MG/ML IV SOLN
200.0000 mg | Freq: Once | INTRAVENOUS | Status: AC
  Administered 2024-04-04: 200 mg via INTRAVENOUS

## 2024-04-04 NOTE — Patient Instructions (Signed)

## 2024-04-05 ENCOUNTER — Other Ambulatory Visit: Payer: Self-pay | Admitting: Family Medicine

## 2024-04-05 DIAGNOSIS — J069 Acute upper respiratory infection, unspecified: Secondary | ICD-10-CM | POA: Diagnosis not present

## 2024-04-05 MED ORDER — MONTELUKAST SODIUM 10 MG PO TABS: 10.0000 mg | ORAL_TABLET | Freq: Every day | ORAL | 1 refills | Status: AC

## 2024-04-05 MED ORDER — CETIRIZINE HCL 10 MG PO TABS: 10.0000 mg | ORAL_TABLET | Freq: Every day | ORAL | 1 refills | Status: AC

## 2024-04-11 ENCOUNTER — Inpatient Hospital Stay

## 2024-04-12 ENCOUNTER — Encounter: Payer: Self-pay | Admitting: Family Medicine

## 2024-04-12 ENCOUNTER — Ambulatory Visit: Admitting: Family Medicine

## 2024-04-12 ENCOUNTER — Ambulatory Visit: Attending: Family Medicine

## 2024-04-12 VITALS — BP 119/80 | HR 97 | Temp 98.2°F | Ht 65.0 in | Wt 345.2 lb

## 2024-04-12 DIAGNOSIS — R42 Dizziness and giddiness: Secondary | ICD-10-CM | POA: Diagnosis not present

## 2024-04-12 NOTE — Progress Notes (Signed)
 "  Established Patient Office Visit  Subjective   Patient ID: Charlene Gregory, female    DOB: 03-Oct-1985  Age: 38 y.o. MRN: [id number]  Chief Complaint  Patient presents with   Follow-up    HPI Delightful 38 year old woman with heavy menses, iron  deficiency anemia, thalassemia minor. Discussed the use of AI scribe software for clinical note transcription with the patient, who gave verbal consent to proceed.  History of Present Illness   Charlene Gregory is a 38 year old female with heavy menses, iron  deficiency anemia, and thalassemia minor who presents with lightheadedness.  She experiences sporadic episodes of lightheadedness that occur without a consistent pattern, whether sitting or standing, and resolve quickly, typically lasting around 30 seconds. No associated symptoms such as palpitations, sweating, chest pain, SOB or nausea are present.  She has a history of alpha thalassemia and iron  deficiency anemia, for which she has been receiving iron  infusions. Initially, she received five infusions, followed by a recommendation for three more, of which she has completed two. Her next infusion is scheduled for the upcoming Monday. Despite these treatments, she continues to experience lightheadedness.  Her menstrual periods are described as 'brutal,' with heavy bleeding and clotting, particularly in the first few days. She is not currently on any birth control and has previously experienced pain with an IUD.  She drinks primarily water, avoiding sodas and juices due to acid reflux, and reports consuming at least four to five bottles of water daily. Despite this, she was informed of dehydration based on lab results.       Objective:     BP 119/80   Pulse 97   Temp 98.2 F (36.8 C) (Oral)   Ht 5' 5 (1.651 m)   Wt (!) 345 lb 4 oz (156.6 kg)   LMP 03/22/2024   SpO2 97%   BMI 57.45 kg/m    Physical Exam Vitals and nursing note reviewed.  Constitutional:      Appearance: Normal  appearance.  HENT:     Head: Normocephalic and atraumatic.  Eyes:     Conjunctiva/sclera: Conjunctivae normal.  Cardiovascular:     Rate and Rhythm: Normal rate and regular rhythm.  Pulmonary:     Effort: Pulmonary effort is normal.     Breath sounds: Normal breath sounds.  Musculoskeletal:     Right lower leg: No edema.     Left lower leg: No edema.  Skin:    General: Skin is warm and dry.  Neurological:     Mental Status: She is alert and oriented to person, place, and time.  Psychiatric:        Mood and Affect: Mood normal.        Behavior: Behavior normal.        Thought Content: Thought content normal.        Judgment: Judgment normal.          No results found for any visits on 04/12/24.    The ASCVD Risk score (Arnett DK, et al., 2019) failed to calculate for the following reasons:   The 2019 ASCVD risk score is only valid for ages 3 to 20   * - Cholesterol units were assumed    Assessment & Plan:  Dizziness Assessment & Plan: Occurs randomly whether sitting or standing at rest or doing something and lasts about 30 to 60 seconds.  No associated symptoms.  EKG normal sinus rhythm rate 98 and no ST or T wave changes suggestive of ischemia.  Will get  a Zio patch for 2 weeks and check labs.  Orders: -     Comprehensive metabolic panel with GFR -     Lipid panel -     TSH -     Magnesium -     T4, free -     LONG TERM MONITOR (3-14 DAYS); Future -     EKG 12-Lead     Return in about 4 weeks (around 05/10/2024).    Mikie Misner K Edell Mesenbrink, MD "

## 2024-04-12 NOTE — Assessment & Plan Note (Signed)
 Occurs randomly whether sitting or standing at rest or doing something and lasts about 30 to 60 seconds.  No associated symptoms.  EKG normal sinus rhythm rate 98 and no ST or T wave changes suggestive of ischemia.  Will get a Zio patch for 2 weeks and check labs.

## 2024-04-13 LAB — TSH: TSH: 0.926 u[IU]/mL (ref 0.450–4.500)

## 2024-04-13 LAB — COMPREHENSIVE METABOLIC PANEL WITH GFR
ALT: 9 IU/L (ref 0–32)
AST: 13 IU/L (ref 0–40)
Albumin: 4.2 g/dL (ref 3.9–4.9)
Alkaline Phosphatase: 95 IU/L (ref 41–116)
BUN/Creatinine Ratio: 11 (ref 9–23)
BUN: 8 mg/dL (ref 6–20)
Bilirubin Total: 0.4 mg/dL (ref 0.0–1.2)
CO2: 20 mmol/L (ref 20–29)
Calcium: 9.4 mg/dL (ref 8.7–10.2)
Chloride: 100 mmol/L (ref 96–106)
Creatinine, Ser: 0.73 mg/dL (ref 0.57–1.00)
Globulin, Total: 3.4 g/dL (ref 1.5–4.5)
Glucose: 77 mg/dL (ref 70–99)
Potassium: 4.4 mmol/L (ref 3.5–5.2)
Sodium: 135 mmol/L (ref 134–144)
Total Protein: 7.6 g/dL (ref 6.0–8.5)
eGFR: 109 mL/min/1.73

## 2024-04-13 LAB — T4, FREE: Free T4: 1.1 ng/dL (ref 0.82–1.77)

## 2024-04-13 LAB — LIPID PANEL
Chol/HDL Ratio: 2.9 ratio (ref 0.0–4.4)
Cholesterol, Total: 146 mg/dL (ref 100–199)
HDL: 51 mg/dL
LDL Chol Calc (NIH): 82 mg/dL (ref 0–99)
Triglycerides: 63 mg/dL (ref 0–149)
VLDL Cholesterol Cal: 13 mg/dL (ref 5–40)

## 2024-04-13 LAB — MAGNESIUM: Magnesium: 2 mg/dL (ref 1.6–2.3)

## 2024-04-15 ENCOUNTER — Inpatient Hospital Stay

## 2024-04-16 ENCOUNTER — Ambulatory Visit: Payer: Self-pay | Admitting: Family Medicine

## 2024-04-22 ENCOUNTER — Inpatient Hospital Stay

## 2024-04-22 VITALS — BP 123/79 | HR 80 | Temp 97.0°F | Resp 18

## 2024-04-22 DIAGNOSIS — D500 Iron deficiency anemia secondary to blood loss (chronic): Secondary | ICD-10-CM

## 2024-04-22 MED ORDER — IRON SUCROSE 20 MG/ML IV SOLN
200.0000 mg | Freq: Once | INTRAVENOUS | Status: AC
  Administered 2024-04-22: 200 mg via INTRAVENOUS
  Filled 2024-04-22: qty 10

## 2024-04-22 MED ORDER — SODIUM CHLORIDE 0.9% FLUSH
10.0000 mL | Freq: Once | INTRAVENOUS | Status: AC | PRN
  Administered 2024-04-22: 10 mL
  Filled 2024-04-22: qty 10

## 2024-04-24 DIAGNOSIS — Z30430 Encounter for insertion of intrauterine contraceptive device: Secondary | ICD-10-CM | POA: Diagnosis not present

## 2024-04-24 DIAGNOSIS — Z113 Encounter for screening for infections with a predominantly sexual mode of transmission: Secondary | ICD-10-CM | POA: Diagnosis not present

## 2024-05-01 ENCOUNTER — Telehealth: Payer: Self-pay | Admitting: *Deleted

## 2024-05-01 NOTE — Telephone Encounter (Signed)
 The patient below informed us  that their Zio device has been returned to sender. We have verified the correct shipping address with the patient. We have placed an order for a replacement device to be shipped to the patient.     The original device has been marked as lost. We have also placed a hold on the billing in case the original monitor arrives.      Patient initialsBETHA RADDLE   MRN: [id number]   Serial Number: [id number]     If you have any questions, please respond to this email, or call our Customer Care team at [phone] and reference number [id number].

## 2024-05-06 ENCOUNTER — Encounter: Payer: Self-pay | Admitting: Family Medicine

## 2024-05-08 ENCOUNTER — Other Ambulatory Visit: Payer: Self-pay | Admitting: Family Medicine

## 2024-05-13 ENCOUNTER — Ambulatory Visit: Admitting: Family Medicine

## 2024-05-14 ENCOUNTER — Other Ambulatory Visit: Payer: Self-pay | Admitting: Family Medicine

## 2024-05-14 ENCOUNTER — Ambulatory Visit: Attending: Family Medicine

## 2024-05-14 ENCOUNTER — Telehealth: Payer: Self-pay | Admitting: Family Medicine

## 2024-05-14 DIAGNOSIS — R42 Dizziness and giddiness: Secondary | ICD-10-CM

## 2024-05-14 DIAGNOSIS — R002 Palpitations: Secondary | ICD-10-CM

## 2024-05-14 NOTE — Telephone Encounter (Signed)
 CRM # [id number] Owner: None Status: Resolved Open  Priority: Routine Created on: 05/14/2024 09:26 AM By: Janina Needles   Primary Information  Source  Charlene Gregory (Patient)   Subject  Charlene Gregory (Patient)   Topic  Clinical - Medication Question    Communication  Reason for CRM: Pt was told to use and wear Eye Rhithym device. Pt received a notification that her device was delivered but has not received any packages.        She called the company and they advised to have her ask her PCP office to see if there are extra eye rhythm device that can be used for pt.      Called CAL no answer. Please call pt and advise.

## 2024-05-15 NOTE — Progress Notes (Unsigned)
" °  Cardiology Office Note   Date:  05/16/2024  ID:  Wilhemina Grall, DOB Jan 27, 1986, MRN [id number] PCP: [name], [name], MD  [facility] Providers Cardiologist:  Caron Poser, MD     History of Present Illness Charlene Gregory is a 39 y.o. female PMH morbid obesity, HTN who presents for further evaluation management of paroxysmal tachycardia and palpitations.  Evaluated by PCP for this issue 05/14/2024.  Normal thyroid  testing 03/2024.  LDL 82 03/2024.  CMP unremarkable.  CBC notable for some mild anemia which has been chronic.  Monitor was ordered.  Patient notes that she has consistent palpitations that seem to be worse at night when she is laying down.  She does endorse that anxiety could be playing a role.  Denies any syncopal episodes. She has alpha-thalassemia which she is seeing hematology for.   Relevant CVD History -Monitor pending   ROS: Pt denies any chest discomfort, jaw pain, arm pain, syncope, presyncope, orthopnea, PND, or LE edema.  Studies Reviewed I have independently reviewed the patient's ECG, previous medical records, previous blood work.  Physical Exam VS:  BP 116/70 (BP Location: Left Arm, Patient Position: Sitting, Cuff Size: Large)   Pulse 85   Ht 5' 4 (1.626 m)   Wt (!) 347 lb (157.4 kg)   SpO2 99%   BMI 59.56 kg/m        Wt Readings from Last 3 Encounters:  05/16/24 (!) 347 lb (157.4 kg)  04/12/24 (!) 345 lb 4 oz (156.6 kg)  03/28/24 (!) 345 lb 9.6 oz (156.8 kg)    GEN: No acute distress. NECK: No JVD; No carotid bruits. CARDIAC: RRR, no murmurs, rubs, gallops. RESPIRATORY:  Clear to auscultation. EXTREMITIES:  Warm and well-perfused. No edema.  ASSESSMENT AND PLAN Paroxysmal tachycardia Palpitations Patient presents with paroxysmal tachycardia and palpitations which are on differential. Symptoms are worse at night, no syncope or other high risk symptoms. Does have chronic anemia from alpha thalassemia which is being treated.  Normal  thyroid  testing.  No other reversible causes identified.  Plan: - Echocardiogram to evaluate for structural causes - Zio monitor has been ordered by PCP; she has not received this yet.  I advised her to let me know next week if she still has not received a monitor, and we will have her come in to be fitted for one - Further plans pending results  HTN Well-controlled.  Continue Norvasc  10 mg daily.        Dispo: RTC 3 months or sooner PRN  Signed, Caron Poser, MD  "

## 2024-05-16 ENCOUNTER — Other Ambulatory Visit: Payer: Self-pay | Admitting: Family Medicine

## 2024-05-16 ENCOUNTER — Ambulatory Visit

## 2024-05-16 VITALS — BP 116/70 | HR 85 | Ht 64.0 in | Wt 347.0 lb

## 2024-05-16 DIAGNOSIS — I10 Essential (primary) hypertension: Secondary | ICD-10-CM

## 2024-05-16 DIAGNOSIS — R002 Palpitations: Secondary | ICD-10-CM | POA: Diagnosis not present

## 2024-05-16 DIAGNOSIS — I479 Paroxysmal tachycardia, unspecified: Secondary | ICD-10-CM | POA: Diagnosis not present

## 2024-05-16 MED ORDER — ZEPBOUND 2.5 MG/0.5ML ~~LOC~~ SOAJ: 2.5000 mg | SUBCUTANEOUS | 0 refills | Status: AC

## 2024-05-16 NOTE — Patient Instructions (Signed)
 Medication Instructions:  Your physician recommends that you continue on your current medications as directed. Please refer to the Current Medication list given to you today.   *If you need a refill on your cardiac medications before your next appointment, please call your pharmacy*  Lab Work: No labs ordered today  If you have labs (blood work) drawn today and your tests are completely normal, you will receive your results only by: MyChart Message (if you have MyChart) OR A paper copy in the mail If you have any lab test that is abnormal or we need to change your treatment, we will call you to review the results.  Testing/Procedures: Your physician has requested that you have an echocardiogram. Echocardiography is a painless test that uses sound waves to create images of your heart. It provides your doctor with information about the size and shape of your heart and how well your hearts chambers and valves are working.   You may receive an ultrasound enhancing agent through an IV if needed to better visualize your heart during the echo. This procedure takes approximately one hour.  There are no restrictions for this procedure.  This will take place at [address] [facility]) [address], [place] [address]  Please note: We ask at that you not bring children with you during ultrasound (echo/ vascular) testing. Due to room size and safety concerns, children are not allowed in the ultrasound rooms during exams. Our front office staff cannot provide observation of children in our lobby area while testing is being conducted. An adult accompanying a patient to their appointment will only be allowed in the ultrasound room at the discretion of the ultrasound technician under special circumstances. We apologize for any inconvenience.   Follow-Up: At [facility], you and your health needs are our priority.  As part of our continuing mission to provide you with exceptional heart  care, our providers are all part of one team.  This team includes your primary Cardiologist (physician) and Advanced Practice Providers or APPs (Physician Assistants and Nurse Practitioners) who all work together to provide you with the care you need, when you need it.  Your next appointment:   3 month(s)  Provider:   You may see Caron Poser, MD or one of the following Advanced Practice Providers on your designated Care Team:   Lonni Meager, NP Lesley Maffucci, PA-C Bernardino Bring, PA-C Cadence Rubicon, PA-C Tylene Lunch, NP Barnie Hila, NP    We recommend signing up for the patient portal called MyChart.  Sign up information is provided on this After Visit Summary.  MyChart is used to connect with patients for Virtual Visits (Telemedicine).  Patients are able to view lab/test results, encounter notes, upcoming appointments, etc.  Non-urgent messages can be sent to your provider as well.   To learn more about what you can do with MyChart, go to [email or web address].

## 2024-05-21 ENCOUNTER — Ambulatory Visit: Payer: Self-pay

## 2024-05-21 NOTE — Telephone Encounter (Signed)
 FYI Only or Action Required?: FYI only for provider: appointment scheduled on 05/22/2024.  Patient was last seen in primary care on 04/12/2024 by [name], [name], MD.  Called Nurse Triage reporting Knee Pain and Sinusitis.  Symptoms began several weeks ago.  Interventions attempted: OTC medications: advil .  Symptoms are: unchanged.  Triage Disposition: See PCP When Office is Open (Within 3 Days)  Patient/caregiver understands and will follow disposition?: Yes  Message from [facility] sent at 05/21/2024 10:19 AM EST  Reason for Triage: Worsening knee pain for over a month, plus congestion from her sinuses.   Reason for Disposition  [1] MODERATE pain (e.g., interferes with normal activities, limping) AND [2] present > 3 days  Answer Assessment - Initial Assessment Questions 1. LOCATION and RADIATION: Where is the pain located?      Bilateral knees  left worse than right 2. QUALITY: What does the pain feel like?  (e.g., sharp, dull, aching, burning)     achy 3. SEVERITY: How bad is the pain? What does it keep you from doing?   (Scale 1-10; or mild, moderate, severe)     Rest, 4/10. Activity 7/10 4. ONSET: When did the pain start? Does it come and go, or is it there all the time?     Three weeks ago 5. RECURRENT: Have you had this pain before? If Yes, ask: When, and what happened then?     denies 6. SETTING: Has there been any recent work, exercise or other activity that involved that part of the body?     Has recently changed duties at work. Was active and is now sitting 7. AGGRAVATING FACTORS: What makes the knee pain worse? (e.g., walking, climbing stairs, running)     Prolonged standing or walking. New jog has increased sitting 8. ASSOCIATED SYMPTOMS: Is there any swelling or redness of the knee?     Denies swelling or redness 9. OTHER SYMPTOMS: Do you have any other symptoms? (e.g., calf pain, chest pain, difficulty breathing, fever)      denies  Protocols used: Knee Pain-A-AH

## 2024-05-22 ENCOUNTER — Ambulatory Visit: Admitting: Family Medicine

## 2024-05-22 ENCOUNTER — Encounter: Payer: Self-pay | Admitting: Family Medicine

## 2024-05-22 ENCOUNTER — Encounter (HOSPITAL_BASED_OUTPATIENT_CLINIC_OR_DEPARTMENT_OTHER): Payer: Self-pay

## 2024-05-22 VITALS — BP 124/82 | HR 106 | Temp 98.4°F | Resp 16 | Ht 64.0 in | Wt 348.4 lb

## 2024-05-22 DIAGNOSIS — D500 Iron deficiency anemia secondary to blood loss (chronic): Secondary | ICD-10-CM | POA: Diagnosis not present

## 2024-05-22 DIAGNOSIS — F419 Anxiety disorder, unspecified: Secondary | ICD-10-CM | POA: Insufficient documentation

## 2024-05-22 DIAGNOSIS — M25561 Pain in right knee: Secondary | ICD-10-CM | POA: Insufficient documentation

## 2024-05-22 DIAGNOSIS — M25562 Pain in left knee: Secondary | ICD-10-CM

## 2024-05-22 MED ORDER — HYDROXYZINE HCL 10 MG PO TABS: 10.0000 mg | ORAL_TABLET | Freq: Three times a day (TID) | ORAL | 1 refills | Status: AC | PRN

## 2024-05-22 NOTE — Assessment & Plan Note (Addendum)
 From her heavy menstrual bleeding, 12 /2 /25 her hemoglobin was 10.3 up from 7.2.,  Ferritin was 36 and iron  saturation was 6%, reticulocyte count was 22.5.  She is currently not taking iron  but has a Mirena IUD which has slowed her menstrual bleeding.  She is scheduled to 6-week follow-up on her Mirena on February 19.

## 2024-05-22 NOTE — Progress Notes (Signed)
 "  Established Patient Office Visit  Subjective   Patient ID: Charlene Gregory, female    DOB: 1985-08-26  Age: 39 y.o. MRN: [id number]  Chief Complaint  Patient presents with   Knee Pain    Bilateral  knee pain. 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   Nasal Congestion    Nasal congestion. Started 1 week ago. Been taking mucinex-D.     HPI Discussed the use of AI scribe software for clinical note transcription with the patient, who gave verbal consent to proceed.  History of Present Illness   Charlene Gregory is a 39 year old female with anemia who presents with concerns about her heart rate and knee pain.  She has a history of anemia, having received eight iron  infusions. Despite treatment, recent labs show a hemoglobin level of 10, ferritin of 36, and iron  saturation of 6%. She did get a Mirena IUD.  Since starting the Mirena, her menstrual bleeding has become lighter with less clotting.  She is experiencing stress related to her heart rate. She has been referred to a cardiologist, undergone a normal EKG, and is scheduled for an echocardiogram. She is currently wearing a Zio patch for heart monitoring. There is a family history of congestive heart failure in her mother. She experiences sporadic dizziness but no pain, diaphoresis or feeling like she is going to black out.  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 The pain is described as aching and began after starting this physically demanding work. She has tried muscle rubs, Epsom salt baths, and propping her leg up at night, which provide temporary relief. She is concerned about the impact of this pain on her ability to work.  She has not taken an NSAID  She takes Zyrtec  and montelukast  for allergies and uses Flonase daily. She has an inhaler for as-needed use during bad colds or coughs but does not use it regularly. She denies having been diagnosed with asthma but mentions a  possible connection to COVID-19.  She reports significant stress and anxiety related to her medical issues and the demands of caring for her child, who is on the autism spectrum. She describes herself as an theatre manager and finds the ongoing medical appointments and health concerns overwhelming. She Googles her conditions and symptoms and this makes her anxiety worse.        She takes Zyrtec  and montelukast  for allergies and uses Flonase daily. She has an inhaler for as-needed use during bad colds or coughs but does not use it regularly. She denies having been diagnosed with asthma but mentions a possible connection to COVID-19.  She reports significant stress and anxiety related to her medical issues and the demands of caring for her child, who is on the autism spectrum. She describes herself as an theatre manager and finds the ongoing medical appointments and health concerns overwhelming.   She has nasal congestion and has been using Flonase Zyrtec  and montelukast .  She still was symptomatic so she got Mucinex D with phenylephrine  and it.  Please do not take the medications that she have to ask the pharmacist for because they I have phenylephrine  or Sudafed in them and both can raise your heart rate and give you palpitations.        Objective:     BP 124/82   Pulse (!) 106   Temp 98.4 F (36.9 C) (Oral)   Resp 16   Ht 5' 4 (1.626 m)  Wt (!) 348 lb 6.4 oz (158 kg)   LMP 05/13/2024 (Approximate) Comment: has mirena  SpO2 98%   BMI 59.80 kg/m    Physical Exam Vitals and nursing note reviewed.  Constitutional:      Appearance: Normal appearance.  HENT:     Head: Normocephalic and atraumatic.  Eyes:     Conjunctiva/sclera: Conjunctivae normal.  Cardiovascular:     Rate and Rhythm: Normal rate and regular rhythm.  Pulmonary:     Effort: Pulmonary effort is normal.     Breath sounds: Normal breath sounds.  Musculoskeletal:     Right lower leg: No edema.     Left lower leg: No  edema.  Skin:    General: Skin is warm and dry.  Neurological:     Mental Status: She is alert and oriented to person, place, and time.  Psychiatric:        Mood and Affect: Mood normal.        Behavior: Behavior normal.        Thought Content: Thought content normal.        Judgment: Judgment normal.          No results found for any visits on 05/22/24.    The ASCVD Risk score (Arnett DK, et al., 2019) failed to calculate for the following reasons:   The 2019 ASCVD risk score is only valid for ages 16 to 21    Assessment & Plan:  Anxiety Assessment & Plan: She is stressed out over her multiple medical problems right now.  She denies for feeling suicidal or homicidal.  She denies having any depression.  Reports her stress is exacerbation by overthinking her medical concerns and by googling.  Trial hydroxyzine  10 mg 3 times daily as needed anxiety.  Orders: -     hydrOXYzine  HCl; Take 1 tablet (10 mg total) by mouth 3 (three) times daily as needed for anxiety.  Dispense: 30 tablet; Refill: 1  Iron  deficiency anemia due to chronic blood loss Assessment & Plan: From her heavy menstrual bleeding, 12 /2 /25 her hemoglobin was 10.3 up from 7.2.,  Ferritin was 36 and iron  saturation was 6%, reticulocyte count was 22.5.  She is currently not taking iron  but has a Mirena IUD which has slowed her menstrual bleeding.  She is scheduled to 6-week follow-up on her Mirena on February 19.   Acute pain of both knees Assessment & Plan: Likely overuse from cleaning activities.  Pain is exacerbated by physical activity and relieved temporarily by muscle rubs, Epsom salts and leg elevation.  No acute injury reported.  Encouraged ibuprofen  600 mg Q6 with food.  Take this when your knee pain is severe.  Trial Boswellia as an anti-inflammatory, available on [employer], advise reducing sugar, sweets and carbohydrates to help with joint pain      Return in about 4 weeks (around 06/19/2024).    Charlene Gregory  K Jimel Myler, MD "

## 2024-05-22 NOTE — Assessment & Plan Note (Signed)
 Likely overuse from cleaning activities.  Pain is exacerbated by physical activity and relieved temporarily by muscle rubs, Epsom salts and leg elevation.  No acute injury reported.  Encouraged ibuprofen  600 mg Q6 with food.  Take this when your knee pain is severe.  Trial Boswellia as an anti-inflammatory, available on [employer], advise reducing sugar, sweets and carbohydrates to help with joint pain

## 2024-05-22 NOTE — Assessment & Plan Note (Signed)
 She is stressed out over her multiple medical problems right now.  She denies for feeling suicidal or homicidal.  She denies having any depression.  Reports her stress is exacerbation by overthinking her medical concerns and by googling.  Trial hydroxyzine  10 mg 3 times daily as needed anxiety.

## 2024-05-24 ENCOUNTER — Ambulatory Visit: Admitting: Family Medicine

## 2024-05-24 ENCOUNTER — Other Ambulatory Visit: Payer: Self-pay | Admitting: Family Medicine

## 2024-05-24 ENCOUNTER — Telehealth: Payer: Self-pay | Admitting: Family Medicine

## 2024-05-24 MED ORDER — IBUPROFEN 600 MG PO TABS: 600.0000 mg | ORAL_TABLET | Freq: Three times a day (TID) | ORAL | 1 refills | Status: AC | PRN

## 2024-05-24 NOTE — Telephone Encounter (Signed)
 Copied from CRM #[id number]. Topic: Clinical - Prescription Issue >> May 24, 2024  9:56 AM Montie POUR wrote: Reason for CRM:  Sian checked with her pharmacy and IBUpropen 600mg  has not been sent in. Please check on order and let Carrigan know if there are any questions. Please send a message through MyChart when medication is sent.

## 2024-05-28 ENCOUNTER — Other Ambulatory Visit: Payer: Self-pay

## 2024-05-28 DIAGNOSIS — I10 Essential (primary) hypertension: Secondary | ICD-10-CM

## 2024-05-28 DIAGNOSIS — I479 Paroxysmal tachycardia, unspecified: Secondary | ICD-10-CM

## 2024-05-28 DIAGNOSIS — R002 Palpitations: Secondary | ICD-10-CM

## 2024-05-29 ENCOUNTER — Ambulatory Visit

## 2024-05-30 ENCOUNTER — Ambulatory Visit

## 2024-05-30 DIAGNOSIS — I479 Paroxysmal tachycardia, unspecified: Secondary | ICD-10-CM

## 2024-05-30 LAB — ECHOCARDIOGRAM COMPLETE
AR max vel: 2.13 cm2
AV Area VTI: 2.17 cm2
AV Area mean vel: 2.14 cm2
AV Mean grad: 5 mmHg
AV Peak grad: 10.2 mmHg
Ao pk vel: 1.6 m/s
Area-P 1/2: 5.38 cm2
Calc EF: 55.7 %
S' Lateral: 3.2 cm
Single Plane A2C EF: 50.3 %
Single Plane A4C EF: 56.1 %

## 2024-05-31 ENCOUNTER — Ambulatory Visit: Payer: Self-pay

## 2024-06-21 ENCOUNTER — Ambulatory Visit: Admitting: Family Medicine

## 2024-07-25 ENCOUNTER — Inpatient Hospital Stay

## 2024-07-30 ENCOUNTER — Inpatient Hospital Stay

## 2024-07-30 ENCOUNTER — Inpatient Hospital Stay: Admitting: Oncology

## 2024-08-14 ENCOUNTER — Ambulatory Visit
